# Patient Record
Sex: Female | Born: 2000 | Race: Black or African American | Hispanic: No | Marital: Single | Smoking: Never smoker
Health system: Southern US, Community
[De-identification: ages and names within clinical notes are randomized; demographics above are authoritative.]

## PROBLEM LIST (undated history)

## (undated) ENCOUNTER — Ambulatory Visit (HOSPITAL_COMMUNITY): Admission: EM | Discharge status: Absent without leave | Payer: Medicaid Other

## (undated) DIAGNOSIS — F329 Major depressive disorder, single episode, unspecified: Secondary | ICD-10-CM

## (undated) DIAGNOSIS — L309 Dermatitis, unspecified: Secondary | ICD-10-CM

## (undated) DIAGNOSIS — F32A Depression, unspecified: Secondary | ICD-10-CM

## (undated) HISTORY — DX: Depression, unspecified: F32.A

## (undated) HISTORY — DX: Dermatitis, unspecified: L30.9

---
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
## 2018-06-26 ENCOUNTER — Ambulatory Visit (INDEPENDENT_AMBULATORY_CARE_PROVIDER_SITE_OTHER): Payer: Medicaid Other | Admitting: Certified Nurse Midwife

## 2018-06-26 ENCOUNTER — Other Ambulatory Visit: Payer: Self-pay

## 2018-06-26 ENCOUNTER — Encounter: Payer: Self-pay | Admitting: Certified Nurse Midwife

## 2018-06-26 DIAGNOSIS — N611 Abscess of the breast and nipple: Secondary | ICD-10-CM

## 2018-06-26 DIAGNOSIS — N939 Abnormal uterine and vaginal bleeding, unspecified: Secondary | ICD-10-CM | POA: Diagnosis not present

## 2018-06-26 NOTE — Progress Notes (Signed)
   TELEHEALTH VIRTUAL GYNECOLOGY VISIT ENCOUNTER NOTE  I connected with Patty Ramos on 06/26/18 at 10:00 AM EDT by telephone at home and verified that I am speaking with the correct person using two identifiers.   I discussed the limitations, risks, security and privacy concerns of performing an evaluation and management service by telephone and the availability of in person appointments. I also discussed with the patient that there may be a patient responsible charge related to this service. The patient expressed understanding and agreed to proceed.   History:  Patty Ramos is a 18 y.o. G0P0000 female being evaluated today for abnormal bleeding and breast pain. She reports AUB has been occurring since being off Depo in May 2019 but bleeding increased in January 2020. She reports hx of mastitis in October 2019 which she completed antibiotics for, over the past 2 weeks breast pain has returned. She denies any abnormal vaginal discharge, pelvic pain or other concerns.       History reviewed. No pertinent past medical history. History reviewed. No pertinent surgical history. The following portions of the patient's history were reviewed and updated as appropriate: allergies, current medications, past family history, past medical history, past social history, past surgical history and problem list.   Health Maintenance:  Has never received a pap smear or mammogram, <19yo.   Review of Systems:  Pertinent items noted in HPI and remainder of comprehensive ROS otherwise negative.  Physical Exam:  Physical exam deferred due to nature of the encounter  Labs and Imaging No results found for this or any previous visit (from the past 336 hour(s)). No results found.    Assessment and Plan:     1. Abnormal uterine bleeding (AUB) - AUB since May 2019 after stopping depo, increased in January 2020  - Reports heavy irregular periods then spotting in between periods  - Was started on birth control  pills by Dr Loletta Parish ([facility]) on 06/23/18, on day 4 today of package - patient is unsure of the name  - Instructed that OCP can take 2-3 months to regular cycle and to continue taking medication - Call office and follow up if continued abnormal bleeding in 2-3 months after starting OCP, patient verbalizes understanding.   2. Abscess of female breast - Hx of mastitis diagnosed in October 2019 per patient, was given antibiotics for 10 days which she completed  - reports pain and tenderness with palpation of right breast for past 2 weeks - Order for ultrasound assessment at breast center     I discussed the assessment and treatment plan with the patient. The patient was provided an opportunity to ask questions and all were answered. The patient agreed with the plan and demonstrated an understanding of the instructions.   The patient was advised to call back or seek an in-person evaluation/go to the ED if the symptoms worsen or if the condition fails to improve as anticipated.  I provided 18 minutes of non-face-to-face time during this encounter.   Sharyon Cable, CNM [facility] [employer], [facility]

## 2018-06-26 NOTE — Progress Notes (Signed)
NGYN Telehealth visit to discuss irregular cycles.  LMP 03/28/2018 has no took at home UPT  Pt states cycles are heavy and she is on her cycle throughout most of the month  Pt states she soaks 3 pads in 1 hr    Pt is sexually active admits to unprotected  intercourse in the past 14 days Contraception: NONE   Pt also c/o breast issue/ pain in one area

## 2018-06-27 ENCOUNTER — Other Ambulatory Visit: Payer: Self-pay | Admitting: Certified Nurse Midwife

## 2018-06-27 DIAGNOSIS — N611 Abscess of the breast and nipple: Secondary | ICD-10-CM

## 2018-06-30 ENCOUNTER — Telehealth: Payer: Self-pay | Admitting: Licensed Clinical Social Worker

## 2018-06-30 NOTE — Telephone Encounter (Signed)
Opened by mistake.

## 2018-07-21 ENCOUNTER — Other Ambulatory Visit: Payer: Self-pay | Admitting: Certified Nurse Midwife

## 2018-07-21 DIAGNOSIS — N611 Abscess of the breast and nipple: Secondary | ICD-10-CM

## 2018-07-24 ENCOUNTER — Encounter (HOSPITAL_COMMUNITY): Payer: Self-pay

## 2018-07-24 ENCOUNTER — Ambulatory Visit (HOSPITAL_COMMUNITY)
Admission: EM | Admit: 2018-07-24 | Discharge: 2018-07-24 | Disposition: A | Discharge status: Home / Self Care | Payer: Medicaid Other | Attending: Emergency Medicine | Admitting: Emergency Medicine

## 2018-07-24 ENCOUNTER — Other Ambulatory Visit: Payer: Self-pay

## 2018-07-24 DIAGNOSIS — N61 Mastitis without abscess: Secondary | ICD-10-CM | POA: Insufficient documentation

## 2018-07-24 DIAGNOSIS — A599 Trichomoniasis, unspecified: Secondary | ICD-10-CM

## 2018-07-24 DIAGNOSIS — Z711 Person with feared health complaint in whom no diagnosis is made: Secondary | ICD-10-CM | POA: Insufficient documentation

## 2018-07-24 DIAGNOSIS — Z3202 Encounter for pregnancy test, result negative: Secondary | ICD-10-CM

## 2018-07-24 DIAGNOSIS — N644 Mastodynia: Secondary | ICD-10-CM | POA: Insufficient documentation

## 2018-07-24 DIAGNOSIS — Z113 Encounter for screening for infections with a predominantly sexual mode of transmission: Secondary | ICD-10-CM | POA: Diagnosis not present

## 2018-07-24 HISTORY — DX: Trichomoniasis, unspecified: A59.9

## 2018-07-24 LAB — POCT PREGNANCY, URINE: Preg Test, Ur: NEGATIVE

## 2018-07-24 MED ORDER — DOXYCYCLINE HYCLATE 100 MG PO CAPS: 100.0000 mg | ORAL_CAPSULE | Freq: Two times a day (BID) | ORAL | 0 refills | Status: DC

## 2018-07-24 NOTE — ED Provider Notes (Addendum)
[facility]   [id number] 07/24/18 Arrival Time: 1006   CC: CHEST discomfort   SUBJECTIVE:  Patty Ramos is a [age] y.o. female who presents with complaint of abrupt chest discomfort that began 2 days ago.  Reports reoccuring mastitis that began in November of 2019.  Localizes chest discomfort to the substernal region and left side of breast.  Describes as stable, that is intermittent (with episodes lasting 3 minutes), sharp and cramping in character.  Rates pain as 0/10.   Has tried medications without relief.  Symptoms made worse with deep breath.  Denies radiating symptoms.  Reports previous symptoms in the past. Complains of associated abdominal discomfort, numbness/tingling in lower extremities, and increased urination.  Denies fever, chills, lightheadedness, dizziness, palpitations, tachycardia, nausea, vomiting, diaphoresis,  peripheral edema, or anxiety.    Denies calf pain or swelling, recent long travel, recent surgery, pregnancy, malignancy, hormone use, or previous blood clot  Admits to smoking black and mild  Also requests STD testing.    ROS: As per HPI. History reviewed. No pertinent past medical history. History reviewed. No pertinent surgical history. No Known Allergies No current facility-administered medications on file prior to encounter.    No current outpatient medications on file prior to encounter.   Social History   Socioeconomic History  . Marital status: Single    Spouse name: Not on file  . Number of children: Not on file  . Years of education: Not on file  . Highest education level: Not on file  Occupational History  . Not on file  Social Needs  . Financial resource strain: Not on file  . Food insecurity:    Worry: Not on file    Inability: Not on file  . Transportation needs:    Medical: Not on file    Non-medical: Not on file  Tobacco Use  . Smoking status: Never Smoker  . Smokeless tobacco: Never Used  Substance and Sexual  Activity  . Alcohol use: No  . Drug use: No  . Sexual activity: Yes    Partners: Male    Birth control/protection: None  Lifestyle  . Physical activity:    Days per week: Not on file    Minutes per session: Not on file  . Stress: Not on file  Relationships  . Social connections:    Talks on phone: Not on file    Gets together: Not on file    Attends religious service: Not on file    Active member of club or organization: Not on file    Attends meetings of clubs or organizations: Not on file    Relationship status: Not on file  . Intimate partner violence:    Fear of current or ex partner: Not on file    Emotionally abused: Not on file    Physically abused: Not on file    Forced sexual activity: Not on file  Other Topics Concern  . Not on file  Social History Narrative  . Not on file   History reviewed. No pertinent family history.   OBJECTIVE:  Vitals:   07/24/18 1024 07/24/18 1026  BP:  (!) 143/92  Pulse:  91  Resp:  17  Temp:  98.8 F (37.1 C)  TempSrc:  Oral  SpO2:  100%  Weight: 191 lb (86.6 kg)   Height: 5\' 4"  (1.626 m)     General appearance: alert; no distress Eyes: PERRLA; EOMI; conjunctiva normal HENT: normocephalic; atraumatic Neck: supple Lungs: clear to auscultation bilaterally without  adventitious breath sounds Heart: regular rate and rhythm.  Clear S1 and S2 without rubs, gallops, or murmur. Chest Wall: Exam limited due to patient sitting in wheelchair; breast appear symmetrical; no obvious lumps or masses; nipples with significant dry skin R>L; no obvious drainage; no obvious erythema; reports mild TTP over nipples Abdomen: soft, non-tender; bowel sounds normal; no masses or organomegaly; no guarding or rebound tenderness Extremities: no cyanosis or edema; symmetrical with no gross deformities; no calf tenderness Skin: warm and dry Psychological: alert and cooperative; normal mood and affect  Results for orders placed or performed during the  hospital encounter of 07/24/18 (from the past 24 hour(s))  Pregnancy, urine POC     Status: None   Collection Time: 07/24/18 11:20 AM  Result Value Ref Range   Preg Test, Ur NEGATIVE NEGATIVE    ASSESSMENT & PLAN:  1. Mastitis   2. Breast pain   3. Concern about STD in female without diagnosis     Meds ordered this encounter  Medications  . doxycycline (VIBRAMYCIN) 100 MG capsule    Sig: Take 1 capsule (100 mg total) by mouth 2 (two) times daily.    Dispense:  20 capsule    Refill:  0    Order Specific Question:   Supervising Provider    Answer:   Eustace Moore [[id number]]   Chest pain likely secondary to reoccurring breast infection Will treat with doxycycline Wash daily with warm water and mild soap Follow up with PCP next week for recheck and to ensure symptoms are improving.   Return or go to the ED if you have any new or worsening symptoms such as fever, chills, nausea, vomiting, redness, swelling, worsening pain, symptoms that do not improve following treatment, etc...  Vaginal self-swab obtained.  We will follow up with you regarding abnormal results.    Reviewed expectations re: course of current medical issues. Questions answered. Outlined signs and symptoms indicating need for more acute intervention. Patient verbalized understanding. After Visit Summary given.     Rennis Harding, PA-C 07/24/18 1158

## 2018-07-24 NOTE — ED Triage Notes (Signed)
Patient presents to Urgent Care with complaints of bilateral leg pain and requests for STD check.. Patient states she has burning with urination and the leg pain has been ongoing and recurrent for months.

## 2018-07-24 NOTE — Discharge Instructions (Signed)
Chest pain likely secondary to reoccurring breast infection Will treat with doxycycline Wash daily with warm water and mild soap Follow up with PCP next week for recheck and to ensure symptoms are improving.   Return or go to the ED if you have any new or worsening symptoms such as fever, chills, nausea, vomiting, redness, swelling, worsening pain, symptoms that do not improve following treatment, etc...  Vaginal self-swab obtained.  We will follow up with you regarding abnormal results.

## 2018-07-25 ENCOUNTER — Telehealth (HOSPITAL_COMMUNITY): Payer: Self-pay | Admitting: Emergency Medicine

## 2018-07-25 LAB — CERVICOVAGINAL ANCILLARY ONLY
Bacterial vaginitis: POSITIVE — AB
Candida vaginitis: POSITIVE — AB
Chlamydia: NEGATIVE
Neisseria Gonorrhea: NEGATIVE
Trichomonas: POSITIVE — AB

## 2018-07-25 MED ORDER — FLUCONAZOLE 150 MG PO TABS: 150.0000 mg | ORAL_TABLET | Freq: Once | ORAL | 0 refills | Status: AC

## 2018-07-25 MED ORDER — METRONIDAZOLE 500 MG PO TABS: 500.0000 mg | ORAL_TABLET | Freq: Two times a day (BID) | ORAL | 0 refills | Status: AC

## 2018-07-25 NOTE — Telephone Encounter (Signed)
Bacterial vaginosis is positive. This was not treated at the urgent care visit.  Flagyl 500 mg BID x 7 days #14 no refills sent to patients pharmacy of choice.    Test for candida (yeast) was positive.  Prescription for fluconazole 150mg po now, repeat dose in 3d if needed, #2 no refills, sent to the pharmacy of record.  Recheck or followup with PCP for further evaluation if symptoms are not improving.    Trichomonas is positive. Rx  for Flagyl 2 grams, once was sent to the pharmacy of record. Pt needs education to refrain from sexual intercourse for 7 days to give the medicine time to work. Sexual partners need to be notified and tested/treated. Condoms may reduce risk of reinfection. Recheck for further evaluation if symptoms are not improving.   Patient contacted and made aware of all results, all questions answered.   

## 2018-07-29 ENCOUNTER — Other Ambulatory Visit: Payer: Self-pay | Admitting: Certified Nurse Midwife

## 2018-07-29 DIAGNOSIS — N611 Abscess of the breast and nipple: Secondary | ICD-10-CM

## 2018-08-04 ENCOUNTER — Other Ambulatory Visit: Payer: Self-pay | Admitting: Certified Nurse Midwife

## 2018-08-04 DIAGNOSIS — N611 Abscess of the breast and nipple: Secondary | ICD-10-CM

## 2018-08-04 DIAGNOSIS — N644 Mastodynia: Secondary | ICD-10-CM

## 2018-08-08 ENCOUNTER — Ambulatory Visit: Payer: Medicaid Other

## 2018-08-08 ENCOUNTER — Ambulatory Visit: Admission: RE | Admit: 2018-08-08 | Payer: Medicaid Other | Source: Ambulatory Visit

## 2018-08-08 ENCOUNTER — Ambulatory Visit
Admission: RE | Admit: 2018-08-08 | Discharge: 2018-08-08 | Disposition: A | Discharge status: Home / Self Care | Payer: Medicaid Other | Source: Ambulatory Visit | Attending: Certified Nurse Midwife | Admitting: Certified Nurse Midwife

## 2018-08-08 ENCOUNTER — Other Ambulatory Visit: Payer: Self-pay | Admitting: Certified Nurse Midwife

## 2018-08-08 ENCOUNTER — Other Ambulatory Visit: Payer: Self-pay

## 2018-08-08 DIAGNOSIS — N644 Mastodynia: Secondary | ICD-10-CM

## 2018-08-08 DIAGNOSIS — Z202 Contact with and (suspected) exposure to infections with a predominantly sexual mode of transmission: Secondary | ICD-10-CM

## 2018-08-08 DIAGNOSIS — N611 Abscess of the breast and nipple: Secondary | ICD-10-CM

## 2018-08-08 MED ORDER — AZITHROMYCIN 500 MG PO TABS: 1000.0000 mg | ORAL_TABLET | Freq: Once | ORAL | 0 refills | Status: DC

## 2018-08-08 NOTE — Progress Notes (Signed)
Patient sent MyChart message notifying provider of partner testing positive for Chlamydia, Rx for treatment sent to pharmacy on file due to recent exposure.   Sharyon Cable, CNM 08/08/18, 1:20 PM

## 2018-09-01 ENCOUNTER — Other Ambulatory Visit: Payer: Self-pay | Admitting: Family Medicine

## 2018-09-19 ENCOUNTER — Telehealth: Payer: Self-pay | Admitting: *Deleted

## 2018-09-19 ENCOUNTER — Other Ambulatory Visit: Payer: Self-pay

## 2018-09-19 ENCOUNTER — Encounter (HOSPITAL_COMMUNITY): Payer: Self-pay

## 2018-09-19 ENCOUNTER — Other Ambulatory Visit: Payer: Medicaid Other

## 2018-09-19 ENCOUNTER — Ambulatory Visit (HOSPITAL_COMMUNITY)
Admission: EM | Admit: 2018-09-19 | Discharge: 2018-09-19 | Disposition: A | Discharge status: Home / Self Care | Payer: Medicaid Other | Attending: Emergency Medicine | Admitting: Emergency Medicine

## 2018-09-19 DIAGNOSIS — Z20822 Contact with and (suspected) exposure to covid-19: Secondary | ICD-10-CM

## 2018-09-19 DIAGNOSIS — R509 Fever, unspecified: Secondary | ICD-10-CM

## 2018-09-19 DIAGNOSIS — N899 Noninflammatory disorder of vagina, unspecified: Secondary | ICD-10-CM

## 2018-09-19 DIAGNOSIS — Z113 Encounter for screening for infections with a predominantly sexual mode of transmission: Secondary | ICD-10-CM

## 2018-09-19 DIAGNOSIS — J039 Acute tonsillitis, unspecified: Secondary | ICD-10-CM

## 2018-09-19 LAB — POCT INFECTIOUS MONO SCREEN: Mono Screen: NEGATIVE

## 2018-09-19 LAB — POCT RAPID STREP A: Streptococcus, Group A Screen (Direct): NEGATIVE

## 2018-09-19 MED ORDER — ACETAMINOPHEN 325 MG PO TABS
ORAL_TABLET | ORAL | Status: AC
  Filled 2018-09-19: qty 2

## 2018-09-19 MED ORDER — ACETAMINOPHEN 325 MG PO TABS
650.0000 mg | ORAL_TABLET | Freq: Once | ORAL | Status: AC
  Administered 2018-09-19: 650 mg via ORAL

## 2018-09-19 MED ORDER — PENICILLIN G BENZATHINE 1200000 UNIT/2ML IM SUSP
1.2000 10*6.[IU] | Freq: Once | INTRAMUSCULAR | Status: AC
  Administered 2018-09-19: 1.2 10*6.[IU] via INTRAMUSCULAR

## 2018-09-19 MED ORDER — PENICILLIN G BENZATHINE 1200000 UNIT/2ML IM SUSP
INTRAMUSCULAR | Status: AC
  Filled 2018-09-19: qty 2

## 2018-09-19 NOTE — Telephone Encounter (Signed)
-----   Message from Zigmund Gottron, NP sent at 09/19/2018  9:29 AM EDT ----- Regarding: covid testing needed

## 2018-09-19 NOTE — ED Triage Notes (Addendum)
Patient presents to Urgent Care with complaints of sore throat that radiates up the left side of her face since yesterday. Patient reports she is having difficulty swallowing, but was drinking a bottle of orange juice in the lobby.  Pt also requesting STD check today.

## 2018-09-19 NOTE — Discharge Instructions (Signed)
We have treated you today for tonsillitis.  Push fluids to ensure adequate hydration and keep secretions thin.  STD screen is pending. Will notify of any positive findings and if any changes to treatment are needed.   Tylenol and/or ibuprofen as needed for pain or fevers.   Due to your symptoms in presence of pandemic I do recommend Covid-19 testing. You will be called to set up an appointment time to be tested at our [facility]. Results take about 2-3 days.  Please self isolate until you receive this results.   If develop worsening of symptoms, unable to swallow your secretions, increased pain or fever, please go to the ER.  If develop

## 2018-09-19 NOTE — ED Provider Notes (Signed)
n [place]    CSN: [id number] Arrival date & time: 09/19/18  7824     History   Chief Complaint Chief Complaint  Patient presents with  . Sore Throat    HPI Patty Ramos is a 18 y.o. female.   Patty Ramos presents with complaints of sore throat. Difficulty with swallowing. Yesterday hot and cold. Fever last night. Left ear pain. Today worse. Worse on the left side of neck and throat. No cough, no congestion. No nausea, vomiting or diarrhea. Headache. No body aches. Hasn't taken any medications for symptoms. No known ill contacts. Works at [employer], not necessarily around many people. Also requests STD screen. Intermittent discharge and irritation. 1 partner, but ex bf cheated her. No urinary symptoms. Without contributing medical history.      ROS per HPI, negative if not otherwise mentioned.      History reviewed. No pertinent past medical history.  There are no active problems to display for this patient.   History reviewed. No pertinent surgical history.  OB History    Gravida  0   Para  0   Term  0   Preterm  0   AB  0   Living  0     SAB  0   TAB  0   Ectopic  0   Multiple  0   Live Births  0            Home Medications    Prior to Admission medications   Medication Sig Start Date End Date Taking? Authorizing Provider  doxycycline (VIBRAMYCIN) 100 MG capsule Take 1 capsule (100 mg total) by mouth 2 (two) times daily. 07/24/18   Lestine Box, PA-C    Family History Family History  Problem Relation Age of Onset  . Healthy Mother     Social History Social History   Tobacco Use  . Smoking status: Never Smoker  . Smokeless tobacco: Never Used  Substance Use Topics  . Alcohol use: No  . Drug use: No     Allergies   Patient has no known allergies.   Review of Systems Review of Systems   Physical Exam Triage Vital Signs ED Triage Vitals  Enc Vitals Group     BP 09/19/18 0842 (!) 150/88   Pulse Rate 09/19/18 0842 (!) 123     Resp 09/19/18 0842 19     Temp 09/19/18 0842 (!) 102.1 F (38.9 C)     Temp Source 09/19/18 0842 Oral     SpO2 09/19/18 0842 99 %     Weight --      Height --      Head Circumference --      Peak Flow --      Pain Score 09/19/18 0840 10     Pain Loc --      Pain Edu? --      Excl. in [place]? --    No data found.  Updated Vital Signs BP (!) 150/88 (BP Location: Right Arm)   Pulse (!) 123   Temp (!) 102.1 F (38.9 C) (Oral)   Resp 19   LMP 08/27/2018   SpO2 99%    Physical Exam Constitutional:      General: She is not in acute distress.    Appearance: She is well-developed.  HENT:     Head: Normocephalic and atraumatic.     Nose:     Comments: Hot potato voice noted; drinking fluids without difficulty  Mouth/Throat:     Tonsils: Tonsillar exudate present. 2+ on the left.  Cardiovascular:     Rate and Rhythm: Regular rhythm. Tachycardia present.     Heart sounds: Normal heart sounds.  Pulmonary:     Effort: Pulmonary effort is normal.     Breath sounds: Normal breath sounds.  Lymphadenopathy:     Cervical: Cervical adenopathy present.  Skin:    General: Skin is warm and dry.  Neurological:     Mental Status: She is alert and oriented to person, place, and time.      UC Treatments / Results  Labs (all labs ordered are listed, but only abnormal results are displayed) Labs Reviewed  CULTURE, GROUP A STREP [facility])  POCT RAPID STREP A  POCT INFECTIOUS MONO SCREEN  POCT INFECTIOUS MONO SCREEN  CERVICOVAGINAL ANCILLARY ONLY    EKG None  Radiology No results found.  Procedures Procedures (including critical care time)  Medications Ordered in UC Medications  acetaminophen (TYLENOL) tablet 650 mg (650 mg Oral Given 09/19/18 0853)  penicillin g benzathine (BICILLIN LA) 1200000 UNIT/2ML injection 1.2 Million Units (1.2 Million Units Intramuscular Given 09/19/18 0946)  acetaminophen (TYLENOL) 325 MG tablet (has no  administration in time range)  penicillin g benzathine (BICILLIN LA) 1200000 UNIT/2ML injection (has no administration in time range)    Initial Impression / Assessment and Plan / UC Course  I have reviewed the triage vital signs and the nursing notes.  Pertinent labs & imaging results that were available during my care of the patient were reviewed by me and considered in my medical decision making (see chart for details).     Negative strep and mono here today. Febrile, tachycardic, with quite impressive left tonsil with exudate and cervical lymphadenopathy. IM bicillin provided with strep culture pending. Recommended covid testing as well with message for order sent. Return precautions provided. Patient verbalized understanding and agreeable to plan.   Final Clinical Impressions(s) / UC Diagnoses   Final diagnoses:  Acute tonsillitis, unspecified etiology     Discharge Instructions     We have treated you today for tonsillitis.  Push fluids to ensure adequate hydration and keep secretions thin.  STD screen is pending. Will notify of any positive findings and if any changes to treatment are needed.   Tylenol and/or ibuprofen as needed for pain or fevers.   Due to your symptoms in presence of pandemic I do recommend Covid-19 testing. You will be called to set up an appointment time to be [facility]. Results take about 2-3 days.  Please self isolate until you receive this results.   If develop worsening of symptoms, unable to swallow your secretions, increased pain or fever, please go to the ER.  If develop     ED Prescriptions    None     Controlled Substance Prescriptions [place] Controlled Substance Registry consulted? Not Applicable   Georgetta HaberBurky, Aniceto Kyser B, NP 09/19/18 1151

## 2018-09-19 NOTE — Telephone Encounter (Signed)
Patient called and scheduled for COVID-19 testing on 09/19/18 at [facility] site. Pt given address. Advised to wear a mask and remain in car at appt time. Understanding verbalized.

## 2018-09-21 LAB — CULTURE, GROUP A STREP (THRC)

## 2018-09-23 LAB — CERVICOVAGINAL ANCILLARY ONLY
Bacterial vaginitis: NEGATIVE
Candida vaginitis: POSITIVE — AB
Chlamydia: NEGATIVE
Neisseria Gonorrhea: NEGATIVE
Trichomonas: NEGATIVE

## 2018-09-24 ENCOUNTER — Telehealth (HOSPITAL_COMMUNITY): Payer: Self-pay | Admitting: Emergency Medicine

## 2018-09-24 LAB — NOVEL CORONAVIRUS, NAA: SARS-CoV-2, NAA: NOT DETECTED

## 2018-09-24 MED ORDER — FLUCONAZOLE 150 MG PO TABS: 150.0000 mg | ORAL_TABLET | Freq: Once | ORAL | 0 refills | Status: AC

## 2018-09-24 NOTE — Telephone Encounter (Signed)
Test for candida (yeast) was positive.  Prescription for fluconazole 150mg po now, repeat dose in 3d if needed, #2 no refills, sent to the pharmacy of record.  Recheck or followup with PCP for further evaluation if symptoms are not improving.    Patient contacted and made aware of all results, all questions answered.   

## 2018-09-25 ENCOUNTER — Telehealth (HOSPITAL_COMMUNITY): Payer: Self-pay | Admitting: Emergency Medicine

## 2018-09-25 NOTE — Telephone Encounter (Signed)
Your test for COVID-19 was negative.  Please continue good preventive care measures, including:  frequent hand-washing, avoid touching your face, cover coughs/sneezes, stay out of crowds and keep a 6 foot distance from others.  If you develop fever/cough/breathlessness, please stay home for 10 days and until you have had 3 consecutive days with cough/breathlessness improving and without fever (without taking a fever reducer). Go to the nearest hospital ED tent for assessment if fever/cough/breathlessness are severe or illness seems like a threat to life..  Patient contacted and made aware of all results, all questions answered.  

## 2018-10-02 ENCOUNTER — Encounter: Payer: Self-pay | Admitting: Obstetrics

## 2018-10-02 ENCOUNTER — Ambulatory Visit (INDEPENDENT_AMBULATORY_CARE_PROVIDER_SITE_OTHER): Payer: Medicaid Other | Admitting: Obstetrics

## 2018-10-02 ENCOUNTER — Other Ambulatory Visit: Payer: Self-pay

## 2018-10-02 VITALS — BP 142/84 | HR 69 | Wt 191.0 lb

## 2018-10-02 DIAGNOSIS — B369 Superficial mycosis, unspecified: Secondary | ICD-10-CM | POA: Diagnosis not present

## 2018-10-02 DIAGNOSIS — R030 Elevated blood-pressure reading, without diagnosis of hypertension: Secondary | ICD-10-CM | POA: Diagnosis not present

## 2018-10-02 DIAGNOSIS — N939 Abnormal uterine and vaginal bleeding, unspecified: Secondary | ICD-10-CM

## 2018-10-02 DIAGNOSIS — G43019 Migraine without aura, intractable, without status migrainosus: Secondary | ICD-10-CM | POA: Diagnosis not present

## 2018-10-02 MED ORDER — NORETHINDRONE ACETATE 5 MG PO TABS: 5.0000 mg | ORAL_TABLET | Freq: Every day | ORAL | 0 refills | Status: DC

## 2018-10-02 MED ORDER — CLOTRIMAZOLE 1 % EX CREA: 1.0000 "application " | TOPICAL_CREAM | Freq: Two times a day (BID) | CUTANEOUS | 1 refills | Status: DC

## 2018-10-02 NOTE — Progress Notes (Signed)
Patient ID: Patty Ramos, female   DOB: 02-01-2001, 18 y.o.   MRN: [id number]  Chief Complaint  Patient presents with  . Gynecologic Exam    HPI Patty Ramos is a 18 y.o. female.  Irregular vaginal bleeding since January.  Contraception:  None.  She also has a history of elevated BP and migraine type headaches.  Has a rash on breasts that she is treating with a steroid cream, with improvement. HPI  History reviewed. No pertinent past medical history.  History reviewed. No pertinent surgical history.  Family History  Problem Relation Age of Onset  . Healthy Mother     Social History Social History   Tobacco Use  . Smoking status: Never Smoker  . Smokeless tobacco: Never Used  Substance Use Topics  . Alcohol use: No  . Drug use: No    No Known Allergies  Current Outpatient Medications  Medication Sig Dispense Refill  . norethindrone (AYGESTIN) 5 MG tablet Take 1 tablet (5 mg total) by mouth daily. 10 tablet 0   No current facility-administered medications for this visit.     Review of Systems Review of Systems Constitutional: negative for fatigue and weight loss Respiratory: negative for cough and wheezing Cardiovascular: negative for chest pain, fatigue and palpitations Gastrointestinal: negative for abdominal pain and change in bowel habits Genitourinary:negative Integument/breast: negative for nipple discharge Musculoskeletal:negative for myalgias Neurological: negative for gait problems and tremors Behavioral/Psych: negative for abusive relationship, depression Endocrine: negative for temperature intolerance      Blood pressure (!) 142/84, pulse 69, weight 191 lb (86.6 kg), last menstrual period 03/30/2018.  Physical Exam Physical Exam General:   alert  Skin:   no rash or abnormalities  Lungs:   clear to auscultation bilaterally  Heart:   regular rate and rhythm, S1, S2 normal, no murmur, click, rub or gallop  Breasts:   scaly and dry periareolar skin  changes    50% of 15 min visit spent on counseling and coordination of care.   Data Reviewed Wet prep and cultures Results for Patty Ramos, Patty Ramos (MRN [id number]) as of 10/02/2018 11:54  Ref. Range 09/19/2018 00:00  Chlamydia Unknown Negative  Neisseria gonorrhea Unknown Negative  Bacterial vaginitis Unknown Negative for Bacterial Vaginitis Microorganisms  Trichomonas Unknown Negative  Candida vaginitis Unknown **POSITIVE for Candida species** (A)   Assessment and Plan:    1. Abnormal uterine bleeding (AUB) - O Rx: - norethindrone (AYGESTIN) 5 MG tablet; Take 1 tablet (5 mg total) by mouth daily.  Dispense: 10 tablet; Refill: 0  2. Intractable migraine without aura and without status migrainosus Rx: - Ambulatory referral to Neurology  3. Elevated blood pressure reading without diagnosis of hypertension Rx: - Ambulatory referral to Internal Medicine  4. Superficial fungal infection of breasts Rx: - Clotrimazole 1% cream prescribed.  Apply bid to affected areas.     Plan    Follow up in 3 months  Orders Placed This Encounter  Procedures  . Ambulatory referral to Neurology    Referral Priority:   Routine    Referral Type:   Consultation    Referral Reason:   Specialty Services Required    Requested Specialty:   Neurology    Number of Visits Requested:   1  . Ambulatory referral to Internal Medicine    Referral Priority:   Routine    Referral Type:   Consultation    Referral Reason:   Specialty Services Required    Requested Specialty:   Internal Medicine  Number of Visits Requested:   1   Meds ordered this encounter  Medications  . norethindrone (AYGESTIN) 5 MG tablet    Sig: Take 1 tablet (5 mg total) by mouth daily.    Dispense:  10 tablet    Refill:  0    Shelly Bombard MD 10-02-2018

## 2018-10-02 NOTE — Progress Notes (Signed)
Pt states she has been bleeding since January. Pt states cycle is heavy.

## 2018-10-20 ENCOUNTER — Telehealth: Payer: Self-pay

## 2018-10-20 ENCOUNTER — Ambulatory Visit: Payer: Medicaid Other | Admitting: Neurology

## 2018-10-20 NOTE — Telephone Encounter (Signed)
Pt arrived 13 mins late for her pt, was asked to r/s.

## 2018-10-21 ENCOUNTER — Encounter: Payer: Self-pay | Admitting: Neurology

## 2018-11-11 ENCOUNTER — Other Ambulatory Visit: Payer: Self-pay

## 2018-11-11 ENCOUNTER — Other Ambulatory Visit: Payer: Self-pay | Admitting: Obstetrics

## 2018-11-11 ENCOUNTER — Ambulatory Visit (INDEPENDENT_AMBULATORY_CARE_PROVIDER_SITE_OTHER): Payer: Medicaid Other | Admitting: Neurology

## 2018-11-11 ENCOUNTER — Encounter: Payer: Self-pay | Admitting: Neurology

## 2018-11-11 VITALS — BP 156/98 | HR 88 | Ht 64.0 in | Wt 193.0 lb

## 2018-11-11 DIAGNOSIS — R0683 Snoring: Secondary | ICD-10-CM | POA: Diagnosis not present

## 2018-11-11 DIAGNOSIS — G43019 Migraine without aura, intractable, without status migrainosus: Secondary | ICD-10-CM

## 2018-11-11 DIAGNOSIS — E669 Obesity, unspecified: Secondary | ICD-10-CM

## 2018-11-11 DIAGNOSIS — N939 Abnormal uterine and vaginal bleeding, unspecified: Secondary | ICD-10-CM

## 2018-11-11 DIAGNOSIS — J351 Hypertrophy of tonsils: Secondary | ICD-10-CM

## 2018-11-11 MED ORDER — RIZATRIPTAN BENZOATE 5 MG PO TABS: 5.0000 mg | ORAL_TABLET | ORAL | 3 refills | Status: DC | PRN

## 2018-11-11 MED ORDER — NORTRIPTYLINE HCL 10 MG PO CAPS: ORAL_CAPSULE | ORAL | 5 refills | Status: DC

## 2018-11-11 NOTE — Patient Instructions (Signed)
You likely have migraine headaches.  Please remember, common headache triggers are: sleep deprivation, dehydration, overheating, stress, hypoglycemia or skipping meals and blood sugar fluctuations, excessive pain medications or excessive alcohol use or caffeine withdrawal. Some people have food triggers such as aged cheese, orange juice or chocolate, especially dark chocolate, or MSG (monosodium glutamate). Try to avoid these headache triggers as much possible. It may be helpful to keep a headache diary to figure out what makes your headaches worse or brings them on and what alleviates them. Some people report headache onset after exercise but studies have shown that regular exercise may actually prevent headaches from coming. If you have exercise-induced headaches, please make sure that you drink plenty of fluid before and after exercising and that you do not over do it and do not overheat.  For migraine prevention, I would like to suggest: Pamelor (generic name: nortriptyline), 10 mg: Take 1 pill daily at bedtime for one week, then 2 pills daily at bedtime thereafter. Common side effects reported are: mouth dryness, drowsiness, confusion, dizziness.   We will do a sleep study to rule out Obstructive sleep apnea which can contribute to recurrent headaches.  For migraine abortive treatment, use Maxalt, 5 mg: take 1 pill early on when you suspect a migraine attack come on. You may take another pill within 2 hours, no more than 2 pills in 24 hours. Most people who take triptans do not have any serious side-effects. However, they can cause drowsiness (remember to not drive or use heavy machinery when drowsy), nausea, dizziness, dry mouth. Less common side effects include strange sensations, such as tightness in your chest or throat, tingling, flushing, and feelings of heaviness or pressure in areas such as the face, limbs, and chest. These in the chest can mimic heart related pain (angina) and may cause alarm,  but usually these sensations are not harmful or a sign of a heart attack. However, if you develop intense chest pain or sensations of discomfort, you should stop taking your medication and consult with me or your PCP or go to the nearest urgent care facility or ER or call 911.

## 2018-11-11 NOTE — Progress Notes (Signed)
Subjective:    Patient ID: Patty Ramos is a 18 y.o. female.  HPI     Huston FoleySaima Glennis Borger, MD, PhD [facility] [address], [address] [address] [place], [place] [id number]  Dear Dr. Clearance CootsHarper, I saw your patient, Patty Ramos, upon your kind request in my neurologic clinic today for initial consultation of her recurrent headaches.  The patient is unaccompanied today.  As you know, Ms. Patty NimrodHarvey is a 18 year old right-handed woman with an underlying medical history of abnormal uterine bleeding, and obesity, who reports recurrent migrainous headaches for the past months.  I reviewed your office note from 10/02/2018.  She was started on a progestin at the time.  She believes her headache frequency has decreased since then to about 1 or 2/week as opposed to nearly daily.  She has associated photophobia and nausea, no significant vomiting.  It helps to go to bed to sleep, sometimes she has a headache at night, she has had elevated blood pressure values without a formal diagnosis of hypertension.  She does snore.  She is not aware of any family history of migraines or sleep apnea.  She has had a recent pharyngitis, treated with antibiotic medication.  She lives with a close friend.  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  She has a left-sided headache often, either in the front or in the temporal area on the left with a throbbing sensation.  She has tried over-the-counter Aleve or ibuprofen which does not typically help very much.  She feels that the headaches can last all day.  She has not noticed any other neurological accompaniments and denies any one-sided weakness, numbness, tingling, slurring of speech or droopy face, she has no visual symptoms, in particular, no blurry vision, no double vision or loss of vision.  She does not drink caffeine daily, she tries to hydrate well with water.  She denies alcohol use.  She smokes about 2 cigarettes/day and smokes marijuana occasionally.  Her Past  Medical History Is Significant For: History reviewed. No pertinent past medical history.  Her Past Surgical History Is Significant For: History reviewed. No pertinent surgical history.  Her Family History Is Significant For: Family History  Problem Relation Age of Onset  . Healthy Mother     Her Social History Is Significant For: Social History   Socioeconomic History  . Marital status: Single    Spouse name: Not on file  . Number of children: Not on file  . Years of education: Not on file  . Highest education level: Not on file  Occupational History  . Not on file  Social Needs  . Financial resource strain: Not on file  . Food insecurity    Worry: Not on file    Inability: Not on file  . Transportation needs    Medical: Not on file    Non-medical: Not on file  Tobacco Use  . Smoking status: Never Smoker  . Smokeless tobacco: Never Used  Substance and Sexual Activity  . Alcohol use: No  . Drug use: No  . Sexual activity: Yes    Partners: Male    Birth control/protection: None  Lifestyle  . Physical activity    Days per week: Not on file    Minutes per session: Not on file  . Stress: Not on file  Relationships  . Social Musicianconnections    Talks on phone: Not on file    Gets together: Not on file    Attends religious service: Not on  file    Active member of club or organization: Not on file    Attends meetings of clubs or organizations: Not on file    Relationship status: Not on file  Other Topics Concern  . Not on file  Social History Narrative  . Not on file    Her Allergies Are:  No Known Allergies:   Her Current Medications Are:  Outpatient Encounter Medications as of 11/11/2018  Medication Sig  . norethindrone (AYGESTIN) 5 MG tablet Take 1 tablet (5 mg total) by mouth daily.  . nortriptyline (PAMELOR) 10 MG capsule take 1 pill daily at bedtime for one week, then 2 pills daily at bedtime thereafter.  . rizatriptan (MAXALT) 5 MG tablet Take 1 tablet (5  mg total) by mouth as needed for migraine. May repeat in 2 h prn, no more than 2 pills/24 h, no more than 3 pills/week.  . [DISCONTINUED] clotrimazole (LOTRIMIN) 1 % cream Apply 1 application topically 2 (two) times daily.   No facility-administered encounter medications on file as of 11/11/2018.   :   Review of Systems:  Out of a complete 14 point review of systems, all are reviewed and negative with the exception of these symptoms as listed below:    Review of Systems  Neurological:       Pt presents today to discuss her migraines. Pt was having migraines daily but since starting PO hormone pills, her migraines have decreased to 1 ever 2-3 days. The migraine will last all day. She has associated nausea and vomiting, light and sound sensitivity. Pt does endorse snoring but has not had a sleep study.  Epworth Sleepiness Scale 0= would never doze 1= slight chance of dozing 2= moderate chance of dozing 3= high chance of dozing  Sitting and reading: 0 Watching TV: 3 Sitting inactive in a public place (ex. Theater or meeting): 0 As a passenger in a car for an hour without a break: 1 Lying down to rest in the afternoon: 0 Sitting and talking to someone: 0 Sitting quietly after lunch (no alcohol): 0 In a car, while stopped in traffic: 0 Total: 4     Objective:  Neurological Exam  Physical Exam Physical Examination:   Vitals:   11/11/18 1012  BP: (!) 156/98  Pulse: 88    General Examination: The patient is a very pleasant 18 y.o. female in no acute distress. She appears well-developed and well-nourished and well groomed.   HEENT: Normocephalic, atraumatic, pupils are equal, round and reactive to light and accommodation. Funduscopic exam is normal with sharp disc margins noted. Extraocular tracking is good without limitation to gaze excursion or nystagmus noted. Normal smooth pursuit is noted. Hearing is grossly intact. Face is symmetric with normal facial animation and normal  facial sensation. Speech is clear with no dysarthria noted. There is no hypophonia. There is no lip, neck/head, jaw or voice tremor. Neck is supple with full range of passive and active motion. There are no carotid bruits on auscultation. Oropharynx exam reveals: mild mouth dryness, adequate dental hygiene and marked airway crowding, due to Small airway entry, slightly elongated uvula and tonsillar size of 2-3+.  Mallampati is class II.  Tongue protrudes centrally in palate elevates symmetrically.  Neck circumference is 15-1/2 inches.   Chest: Clear to auscultation without wheezing, rhonchi or crackles noted.  Heart: S1+S2+0, regular and normal without murmurs, rubs or gallops noted.   Abdomen: Soft, non-tender and non-distended with normal bowel sounds appreciated on auscultation.  Extremities: There  is no pitting edema in the distal lower extremities bilaterally. Pedal pulses are intact.  Skin: Warm and dry without trophic changes noted.  Musculoskeletal: exam reveals no obvious joint deformities, tenderness or joint swelling or erythema.   Neurologically:  Mental status: The patient is awake, alert and oriented in all 4 spheres. Her immediate and remote memory, attention, language skills and fund of knowledge are appropriate. There is no evidence of aphasia, agnosia, apraxia or anomia. Speech is clear with normal prosody and enunciation. Thought process is linear. Mood is normal and affect is normal.  Cranial nerves II - XII are as described above under HEENT exam. In addition: shoulder shrug is normal with equal shoulder height noted. Motor exam: Normal bulk, strength and tone is noted. There is no drift, tremor or rebound. Romberg is negative. Reflexes are 2+ throughout. Babinski: Toes are flexor bilaterally. Fine motor skills and coordination: intact with normal finger taps, normal hand movements, normal rapid alternating patting, normal foot taps and normal foot agility.  Cerebellar testing:  No dysmetria or intention tremor on finger to nose testing. Heel to shin is unremarkable bilaterally. There is no truncal or gait ataxia.  Sensory exam: intact to light touch, pinprick, vibration, temperature sense in the upper and lower extremities.  Gait, station and balance: She stands easily. No veering to one side is noted. No leaning to one side is noted. Posture is age-appropriate and stance is narrow based. Gait shows normal stride length and normal pace. No problems turning are noted. Tandem walk is unremarkable.   Assessment and Plan:  In summary, Patty Ramos is a very pleasant 18 y.o.-year old female with an underlying medical history of abnormal uterine bleeding, and obesity, who Presents for evaluation of her recurrent headaches.  Her history is suggestive of migraine without aura, currently frequent enough to warrant preventative medication.  She has a nonfocal neurological exam.  Because of snoring reported and obesity and recurrent headaches, I would like to proceed with a sleep study to rule out obstructive sleep apnea.  We talked at length about headache triggers, we also talked about management including acute management and preventative medications.  I would like for her to start preventative medication in the form of nortriptyline low dose, 10 mg, with gradual increase.  For abortive use for migraine I suggested Maxalt 5 mg strength as needed, may repeat once after 2 hours.  She was given written instructions and new prescriptions.  She is willing to proceed with a sleep study.  We will call her to schedule this.  She is advised to proceed with treatment of sleep apnea should her sleep study show obstructive sleep apnea.  She would be willing to try AutoPap or CPAP therapy.  I suggested follow-up with 1 of our nurse practitioners in 3 months to see how she has done, she is advised to call with any interim questions or concerns and we will also be in touch by phone regarding her sleep  study scheduling and results thereafter.  I answered all her questions today and she was in agreement.  Thank you very much for allowing me to participate in the care of this nice patient. If I can be of any further assistance to you please do not hesitate to call me at [phone].  Sincerely,   Huston FoleySaima Eevie Lapp, MD, PhD

## 2018-11-19 ENCOUNTER — Other Ambulatory Visit: Payer: Self-pay

## 2018-11-19 DIAGNOSIS — N939 Abnormal uterine and vaginal bleeding, unspecified: Secondary | ICD-10-CM

## 2018-11-21 NOTE — Telephone Encounter (Signed)
Rx request from pharmacy    norethindrone (AYGESTIN) 5 MG tablet        Possible duplicate: Hover to review recent actions on this medication   Sig: Take 1 tablet (5 mg total) by mouth daily.   Disp:  10 tablet  Refills:  0   Start: 11/19/2018   Class: Normal   Non-formulary For: Abnormal uterine bleeding (AUB)   Last ordered: 1 month ago by Shelly Bombard, MD      To be filled at: [id number] - [place], [place]

## 2018-12-01 ENCOUNTER — Ambulatory Visit (HOSPITAL_COMMUNITY)
Admission: EM | Admit: 2018-12-01 | Discharge: 2018-12-01 | Disposition: A | Discharge status: Home / Self Care | Payer: Medicaid Other

## 2018-12-01 ENCOUNTER — Other Ambulatory Visit: Payer: Self-pay

## 2018-12-01 NOTE — ED Notes (Signed)
Staff called patient 3x and checked the waiting area bathroom  before dismissing them off the floor.

## 2018-12-09 ENCOUNTER — Ambulatory Visit (INDEPENDENT_AMBULATORY_CARE_PROVIDER_SITE_OTHER): Payer: Medicaid Other | Admitting: Neurology

## 2018-12-09 DIAGNOSIS — E669 Obesity, unspecified: Secondary | ICD-10-CM

## 2018-12-09 DIAGNOSIS — G43019 Migraine without aura, intractable, without status migrainosus: Secondary | ICD-10-CM

## 2018-12-09 DIAGNOSIS — R0683 Snoring: Secondary | ICD-10-CM

## 2018-12-09 DIAGNOSIS — G4733 Obstructive sleep apnea (adult) (pediatric): Secondary | ICD-10-CM | POA: Diagnosis not present

## 2018-12-09 DIAGNOSIS — G4720 Circadian rhythm sleep disorder, unspecified type: Secondary | ICD-10-CM

## 2018-12-09 DIAGNOSIS — J351 Hypertrophy of tonsils: Secondary | ICD-10-CM

## 2018-12-15 NOTE — Procedures (Signed)
PATIENT'S NAME:  Patty Ramos, Patty Ramos DOB:      [birth year]      MRN:    [id number]     DATE OF RECORDING: 12/09/2018 REFERRING M.D.:  Baltazar Najjar, MD  Study Performed:   Baseline Polysomnogram HISTORY: 18 year old woman with a history of abnormal uterine bleeding, and obesity, who reports recurrent migrainous headaches for the past months. She snores. The patient endorsed the Epworth Sleepiness Scale at 4/24 points and the FSS at   Points. The patient's weight 193 pounds with a height of 64 (inches), resulting in a BMI of 33.1 kg/m2. The patient's neck circumference measured 15.5 inches.  CURRENT MEDICATIONS: Aygestin, Pamelor, Maxalt.   PROCEDURE:  This is a multichannel digital polysomnogram utilizing the Somnostar 11.2 system.  Electrodes and sensors were applied and monitored per AASM Specifications.   EEG, EOG, Chin and Limb EMG, were sampled at 200 Hz.  ECG, Snore and Nasal Pressure, Thermal Airflow, Respiratory Effort, CPAP Flow and Pressure, Oximetry was sampled at 50 Hz. Digital video and audio were recorded.      BASELINE STUDY: Lights Out was at 22:47 and Lights On at 05:00.  Total recording time (TRT) was 373.5 minutes, with a total sleep time (TST) of 364.5 minutes.   The patient's sleep latency was 0.5 minutes.  REM latency was 170 minutes, which is delayed. The sleep efficiency was 97.6 %.     SLEEP ARCHITECTURE: WASO (Wake after sleep onset) was 8 minutes.  There were 1 minutes in Stage N1, 207.5 minutes Stage N2, 123 minutes Stage N3 and 33 minutes in Stage REM.  The percentage of Stage N1 was .3%, Stage N2 was 56.9%, Stage N3 was 33.7%, which is increased, and Stage R (REM sleep) was 9.1%, which is reduced.  RESPIRATORY ANALYSIS:  There were a total of 25 respiratory events:  4 obstructive apneas, 8 central apneas and 0 mixed apneas with a total of 12 apneas and an apnea index (AI) of 2. /hour. There were 13 hypopneas with a hypopnea index of 2.1 /hour. The patient also had 0  respiratory event related arousals (RERAs).      The total APNEA/HYPOPNEA INDEX (AHI) was 4.1 /hour and the total RESPIRATORY DISTURBANCE INDEX was 4.1 /hour.  10 events occurred in REM sleep and 20 events in NREM. The REM AHI was 18.2 /hour, versus a non-REM AHI of 2.7. The patient spent 118.5 minutes of total sleep time in the supine position and 246 minutes in non-supine.. The supine AHI was 8.1/h versus a non-supine AHI of 2.2/h.  OXYGEN SATURATION & C02:  The Wake baseline 02 saturation was 97%, with the lowest being 90%. Time spent below 89% saturation equaled 0 minutes.  AROUSALS:  The arousals were noted as: 30 were spontaneous, 0 were associated with PLMs, 5 were associated with respiratory events.  The patient had a total of 0 Periodic Limb Movements.  The Periodic Limb Movement (PLM) index was 0 and the PLM Arousal index was 0/hour.  Audio and video analysis did not show any abnormal or unusual movements, complex behaviors, phonations or vocalizations. The patient took no bathroom breaks. Moderate to loud snoring was noted. The EKG in normal sinus rhythm (NSR).  IMPRESSION:  1. Primary Snoring 2. Dysfunctions associated with sleep stages or arousals from sleep   RECOMMENDATIONS:  1. This study does not demonstrate any significant obstructive or central sleep disordered breathing, except for snoring and REM related, mild OSA. Treatment with CPAP is not warranted. Avoidance of the supine  sleep position and weight loss are recommended. This study does not support an intrinsic sleep disorder as a cause of the patient's symptoms. Other causes, including circadian rhythm disturbances, an underlying mood disorder, medication effect and/or an underlying medical problem cannot be ruled out. 2. This study shows no significant sleep fragmentation and but some abnormal sleep stage percentages; these are nonspecific findings and per se do not signify an intrinsic sleep disorder or a cause for the  patient's sleep-related symptoms. Causes include (but are not limited to) the first night effect of the sleep study, circadian rhythm disturbances, medication effect or an underlying mood disorder or medical problem.  3. The patient should be cautioned not to drive, work at heights, or operate dangerous or heavy equipment when tired or sleepy. Review and reiteration of good sleep hygiene measures should be pursued with any patient. 4. The [facility]. The referring provider will be notified of the test results.  I certify that I have reviewed the entire raw data recording prior to the issuance of this report in accordance with the Standards of Accreditation of the American Academy of Sleep Medicine (AASM)   Huston FoleySaima Minervia Osso, MD, PhD Diplomat, American Board of Neurology and Sleep Medicine (Neurology and Sleep Medicine)

## 2018-12-15 NOTE — Progress Notes (Signed)
Patient referred by Dr. Jodi Mourning, seen by me on 11/11/18 for migraine HAs, diagnostic PSG on 12/09/18.   Please call and notify the patient that the recent sleep study did not show any significant obstructive sleep apnea, except for snoring and REM related, mild OSA. Treatment with CPAP is not warranted. Avoidance of the supine sleep position and weight loss are recommended.  She can FU as scheduled with NP.  Thanks,  Star Age, MD, PhD [facility])

## 2018-12-16 ENCOUNTER — Telehealth: Payer: Self-pay

## 2018-12-16 NOTE — Telephone Encounter (Signed)
-----   Message from Star Age, MD sent at 12/15/2018  8:18 AM EDT ----- Patient referred by Dr. Jodi Mourning, seen by me on 11/11/18 for migraine HAs, diagnostic PSG on 12/09/18.   Please call and notify the patient that the recent sleep study did not show any significant obstructive sleep apnea, except for snoring and REM related, mild OSA. Treatment with CPAP is not warranted. Avoidance of the supine sleep position and weight loss are recommended.  She can FU as scheduled with NP.  Thanks,  Star Age, MD, PhD [facility])

## 2018-12-16 NOTE — Telephone Encounter (Signed)
I called pt to discuss her sleep study results. No answer, VM not set up yet. Will try again later.

## 2018-12-17 NOTE — Telephone Encounter (Signed)
I called pt and discussed her sleep study results and recommendations. I reminded her of the appt in November with Amy NP. Pt reports that her medications are going well. Pt verbalized understanding of results. Pt had no questions at this time but was encouraged to call back if questions arise.

## 2019-01-01 ENCOUNTER — Other Ambulatory Visit: Payer: Self-pay

## 2019-01-01 DIAGNOSIS — A749 Chlamydial infection, unspecified: Secondary | ICD-10-CM

## 2019-01-07 ENCOUNTER — Other Ambulatory Visit (HOSPITAL_COMMUNITY)
Admission: RE | Admit: 2019-01-07 | Discharge: 2019-01-07 | Disposition: A | Discharge status: Home / Self Care | Payer: Medicaid Other | Source: Ambulatory Visit | Attending: Family Medicine | Admitting: Family Medicine

## 2019-01-07 ENCOUNTER — Other Ambulatory Visit: Payer: Self-pay | Admitting: Family Medicine

## 2019-01-07 ENCOUNTER — Encounter: Payer: Self-pay | Admitting: Family Medicine

## 2019-01-07 ENCOUNTER — Other Ambulatory Visit: Payer: Self-pay

## 2019-01-07 ENCOUNTER — Ambulatory Visit (INDEPENDENT_AMBULATORY_CARE_PROVIDER_SITE_OTHER): Payer: Medicaid Other | Admitting: Family Medicine

## 2019-01-07 VITALS — BP 123/92 | HR 86 | Temp 98.0°F | Ht 64.0 in | Wt 192.0 lb

## 2019-01-07 DIAGNOSIS — Z0000 Encounter for general adult medical examination without abnormal findings: Secondary | ICD-10-CM | POA: Diagnosis not present

## 2019-01-07 DIAGNOSIS — Z01419 Encounter for gynecological examination (general) (routine) without abnormal findings: Secondary | ICD-10-CM | POA: Diagnosis not present

## 2019-01-07 DIAGNOSIS — F329 Major depressive disorder, single episode, unspecified: Secondary | ICD-10-CM

## 2019-01-07 DIAGNOSIS — Z23 Encounter for immunization: Secondary | ICD-10-CM

## 2019-01-07 DIAGNOSIS — N939 Abnormal uterine and vaginal bleeding, unspecified: Secondary | ICD-10-CM

## 2019-01-07 DIAGNOSIS — A749 Chlamydial infection, unspecified: Secondary | ICD-10-CM

## 2019-01-07 DIAGNOSIS — E669 Obesity, unspecified: Secondary | ICD-10-CM

## 2019-01-07 DIAGNOSIS — J351 Hypertrophy of tonsils: Secondary | ICD-10-CM

## 2019-01-07 DIAGNOSIS — R0683 Snoring: Secondary | ICD-10-CM

## 2019-01-07 DIAGNOSIS — G43019 Migraine without aura, intractable, without status migrainosus: Secondary | ICD-10-CM

## 2019-01-07 DIAGNOSIS — L309 Dermatitis, unspecified: Secondary | ICD-10-CM

## 2019-01-07 HISTORY — DX: Chlamydial infection, unspecified: A74.9

## 2019-01-07 MED ORDER — PRENATAL MULTIVITAMIN CH: 1.0000 | ORAL_TABLET | Freq: Every day | ORAL | Status: AC

## 2019-01-07 MED ORDER — TRIAMCINOLONE ACETONIDE 0.1 % EX OINT: 1.0000 "application " | TOPICAL_OINTMENT | Freq: Two times a day (BID) | CUTANEOUS | 1 refills | Status: DC

## 2019-01-07 MED ORDER — NORTRIPTYLINE HCL 10 MG PO CAPS: ORAL_CAPSULE | ORAL | 5 refills | Status: DC

## 2019-01-07 NOTE — Progress Notes (Addendum)
GYN presents for AEX/STD testing.  Declined FLU vaccine.  C/o breast pain 7/10 x1 year.  GAD-7=9

## 2019-01-07 NOTE — Progress Notes (Signed)
GYNECOLOGY ANNUAL PREVENTATIVE CARE ENCOUNTER NOTE  Subjective:   Campbell Stalludrika Normington is a 18 y.o. G0P0000 female here for a annual gynecologic exam. Current complaints: breast nipple dryness, with cracked nipples, worsening eczema.   Denies abnormal vaginal bleeding, discharge, pelvic pain, problems with intercourse or other gynecologic concerns.   She has a history of abnormal uterine bleeding, and was last seen by Dr. Clearance CootsHarper in July, where she was prescribed Norethindrone 5 mg daily for 10 days. Her periods have returned to normal.   Gynecologic History Patient's last menstrual period was 01/04/2019 (exact date). Contraception: declines, counseled on prenatal vitamin usage Last Pap: not applicable, per guidelines should start at age 721. Last mammogram: not applicable Gardisil: has not received, will start today  Obstetric History OB History  Gravida Para Term Preterm AB Living  0 0 0 0 0 0  SAB TAB Ectopic Multiple Live Births  0 0 0 0 0    No past medical history on file.  No past surgical history on file.  Current Outpatient Medications on File Prior to Visit  Medication Sig Dispense Refill  . norethindrone (AYGESTIN) 5 MG tablet Take 1 tablet (5 mg total) by mouth daily. 10 tablet 0  . rizatriptan (MAXALT) 5 MG tablet Take 1 tablet (5 mg total) by mouth as needed for migraine. May repeat in 2 h prn, no more than 2 pills/24 h, no more than 3 pills/week. 10 tablet 3   No current facility-administered medications on file prior to visit.     No Known Allergies  Social History   Socioeconomic History  . Marital status: Single    Spouse name: Not on file  . Number of children: Not on file  . Years of education: Not on file  . Highest education level: Not on file  Occupational History  . Not on file  Social Needs  . Financial resource strain: Not on file  . Food insecurity    Worry: Not on file    Inability: Not on file  . Transportation needs    Medical: Not on  file    Non-medical: Not on file  Tobacco Use  . Smoking status: Never Smoker  . Smokeless tobacco: Never Used  Substance and Sexual Activity  . Alcohol use: No  . Drug use: No  . Sexual activity: Yes    Partners: Male    Birth control/protection: None  Lifestyle  . Physical activity    Days per week: Not on file    Minutes per session: Not on file  . Stress: Not on file  Relationships  . Social Musicianconnections    Talks on phone: Not on file    Gets together: Not on file    Attends religious service: Not on file    Active member of club or organization: Not on file    Attends meetings of clubs or organizations: Not on file    Relationship status: Not on file  . Intimate partner violence    Fear of current or ex partner: Not on file    Emotionally abused: Not on file    Physically abused: Not on file    Forced sexual activity: Not on file  Other Topics Concern  . Not on file  Social History Narrative  . Not on file    Family History  Problem Relation Age of Onset  . Healthy Mother     Diet: Varied Exercise: reports that she is active  The following portions of the  patient's history were reviewed and updated as appropriate: allergies, current medications, past family history, past medical history, past social history, past surgical history and problem list.  Review of Systems Pertinent items noted in HPI and remainder of comprehensive ROS otherwise negative.   Objective:  BP (!) 123/92 (BP Location: Left Arm, Cuff Size: Normal)   Pulse 86   Temp 98 F (36.7 C)   Ht 5\' 4"  (1.626 m)   Wt 192 lb (87.1 kg)   LMP 01/04/2019 (Exact Date)   BMI 32.96 kg/m  CONSTITUTIONAL: Well-developed, well-nourished female in no acute distress.  HENT:  Normocephalic, atraumatic, External right and left ear normal. Oropharynx is clear and moist EYES: Conjunctivae and EOM are normal. Pupils are equal, round, and reactive to light. No scleral icterus.  NECK: Normal range of motion,  supple, no masses.  Normal thyroid.  SKIN: Skin is warm and dry. Areas of dryness noted, with scaling and cracking of the nipples bilaterally. There is a 1 cm max diameter, asymmetric, melanocytic, raised, mole with regular borders on the left breast at 1-2 o'clock. Not diaphoretic. No erythema. No pallor. NEUROLOGIC: Alert and oriented to person, place, and time. Normal reflexes, muscle tone coordination. No cranial nerve deficit noted. PSYCHIATRIC: Normal mood and affect. Normal behavior. Normal judgment and thought content. CARDIOVASCULAR: Normal heart rate noted, regular rhythm RESPIRATORY: Clear to auscultation bilaterally. Effort and breath sounds normal, no problems with respiration noted. BREASTS: Symmetric in size. No masses, skin changes as described above, nipple drainage, or lymphadenopathy. ABDOMEN: Soft, normal bowel sounds, no distention noted.  No tenderness, rebound or guarding.  PELVIC: Normal appearing external genitalia.  No abnormal discharge noted. Normal uterine size, no other palpable masses, no uterine or adnexal tenderness. MUSCULOSKELETAL: Normal range of motion. No tenderness.  No cyanosis, clubbing, or edema.  2+ distal pulses.  Exam done with chaperone present.  Assessment and Plan:  Will follow up results of STI screen and manage accordingly. Encouraged improvement in diet and exercise.  Flu vaccine declined today Gardisil started today  Routine preventative health maintenance measures emphasized. Please refer to After Visit Summary for other counseling recommendations.   1. Encounter for well woman exam with routine gynecological exam - HepB+HepC+HIV Panel - RPR - GC/Chlamydia probe amp ([place])not at [facility] - HIV antibody (with reflex) - Pap next year at age 51, will start Gardasil at this visit - declines contraception, will prescribe prenatal vitamins in case she gets pregnant  2. Abnormal uterine bleeding - has normal periods now  3. Need for  vaccination - HPV vaccine quadravalent 3 dose IM  4. Eczema, unspecified type - Ambulatory referral to Dermatology (also to evaluated melanocytic mole on left breast) - advised application of vaseline to areas of dryness 3-4 times daily, and use of coffee fitlers to provide a barrier between her breasts and her bra - triamcinolone ointment (KENALOG) 0.1 %; Apply 1 application topically 2 (two) times daily. Apply a think coat to affected area twice a day  Dispense: 30 g; Refill: 1  5. Major depressive disorder with current active episode, unspecified depression episode severity, unspecified whether recurrent - has started seeing primary care physician, symptoms under control with nortriptyline - nortriptyline (PAMELOR) 10 MG capsule; take 1 pill daily at bedtime for one week, then 2 pills daily at bedtime thereafter.  Dispense: 60 capsule; Refill: 5   Total face-to-face time with patient: 20 minutes. Over 50% of encounter was spent on counseling and coordination of care.   Yuvia Plant,  Dan Europe, DO OB Fellow, [facility] 01/07/2019 8:58 AM

## 2019-01-07 NOTE — Patient Instructions (Signed)

## 2019-01-08 LAB — CERVICOVAGINAL ANCILLARY ONLY
Chlamydia: POSITIVE — AB
Neisseria Gonorrhea: NEGATIVE

## 2019-01-08 MED ORDER — AZITHROMYCIN 500 MG PO TABS: 1000.0000 mg | ORAL_TABLET | Freq: Once | ORAL | Status: DC

## 2019-01-09 LAB — HEPB+HEPC+HIV PANEL
HIV Screen 4th Generation wRfx: NONREACTIVE
Hep B C IgM: NEGATIVE
Hep B Core Total Ab: NEGATIVE
Hep B E Ab: NEGATIVE
Hep B E Ag: NEGATIVE
Hep B Surface Ab, Qual: NONREACTIVE
Hep C Virus Ab: <0.1 s/co ratio (ref 0.0–0.9)
Hepatitis B Surface Ag: NEGATIVE

## 2019-01-09 LAB — RPR, QUANT+TP ABS (REFLEX)
Rapid Plasma Reagin, Quant: 1:1 {titer} — ABNORMAL HIGH
T Pallidum Abs: NONREACTIVE

## 2019-01-09 LAB — RPR: RPR Ser Ql: REACTIVE — AB

## 2019-01-12 LAB — GC/CHLAMYDIA PROBE AMP (~~LOC~~) NOT AT ARMC
Chlamydia: POSITIVE — AB
Comment: NEGATIVE
Comment: NORMAL
Neisseria Gonorrhea: NEGATIVE

## 2019-01-27 ENCOUNTER — Encounter: Payer: Self-pay | Admitting: *Deleted

## 2019-02-02 ENCOUNTER — Other Ambulatory Visit: Payer: Self-pay

## 2019-02-02 ENCOUNTER — Telehealth: Payer: Self-pay

## 2019-02-02 DIAGNOSIS — Z202 Contact with and (suspected) exposure to infections with a predominantly sexual mode of transmission: Secondary | ICD-10-CM

## 2019-02-02 MED ORDER — AZITHROMYCIN 500 MG PO TABS: 1000.0000 mg | ORAL_TABLET | Freq: Once | ORAL | 0 refills | Status: AC

## 2019-02-02 NOTE — Telephone Encounter (Signed)
Pt called regarding Rx sent for eczema pt needs a refill pt states she needs triamcinolone  1% and a larger tube due to skin irritation all over her body.

## 2019-02-02 NOTE — Telephone Encounter (Signed)
Did I send her for a derm referral? I think she needs one

## 2019-02-02 NOTE — Progress Notes (Signed)
Rx was not sent for STD  Rx sent today.  For pt to pick up pt made aware no unprotected intercourse and TOC in 4-6 wks

## 2019-02-03 NOTE — Telephone Encounter (Signed)
I messaged her on MyChart about the RPR result. Would you mind letting her know that she needs to talk to her PCP about a Derm referral? I'm not comfortable giving her more steroid cream because it can cause hypopigmentation. And she really needs to see derm

## 2019-02-03 NOTE — Telephone Encounter (Signed)
Yes. You did However with her ins she has to contact her PCP that is listed on her card and get them to do the referral.  She also had questions regarding her RPR results.

## 2019-02-03 NOTE — Telephone Encounter (Signed)
Thank you :)

## 2019-02-03 NOTE — Telephone Encounter (Signed)
Understood.  I will let her know to reach out to her PCP.

## 2019-02-10 ENCOUNTER — Ambulatory Visit: Payer: Medicaid Other

## 2019-02-11 ENCOUNTER — Encounter: Payer: Self-pay | Admitting: Family Medicine

## 2019-02-11 ENCOUNTER — Ambulatory Visit: Payer: Medicaid Other | Admitting: Family Medicine

## 2019-02-11 NOTE — Progress Notes (Deleted)
PATIENT: Patty Ramos DOB: [birth year]  REASON FOR VISIT: follow up HISTORY FROM: patient  No chief complaint on file.    HISTORY OF PRESENT ILLNESS: Today 02/11/19 [name] is a 18 y.o. female here today for follow up of headaches. Sleep study showed mild OSA but CPAP not warranted at this time. She was started on nortriptyline 10mg  and Maxalt 5mg .    HISTORY: (copied from Dr note on 11/11/2018)  Dear Dr. Teofilo Pod, I saw your patient, Patty Ramos, upon your kind request in my neurologic clinic today for initial consultation of her recurrent headaches.  The patient is unaccompanied today.  As you know, Ms. Milsap is a 18 year old right-handed woman with an underlying medical history of abnormal uterine bleeding, and obesity, who reports recurrent migrainous headaches for the past months.  I reviewed your office note from 10/02/2018.  She was started on a progestin at the time.  She believes her headache frequency has decreased since then to about 1 or 2/week as opposed to nearly daily.  She has associated photophobia and nausea, no significant vomiting.  It helps to go to bed to sleep, sometimes she has a headache at night, she has had elevated blood pressure values without a formal diagnosis of hypertension.  She does snore.  She is not aware of any family history of migraines or sleep apnea.  She has had a recent pharyngitis, treated with antibiotic medication.  She lives with a close friend.  She works in a 12.  She has a left-sided headache often, either in the front or in the temporal area on the left with a throbbing sensation.  She has tried over-the-counter Aleve or ibuprofen which does not typically help very much.  She feels that the headaches can last all day.  She has not noticed any other neurological accompaniments and denies any one-sided weakness, numbness, tingling, slurring of speech or droopy face, she has no visual symptoms, in particular, no  blurry vision, no double vision or loss of vision.  She does not drink caffeine daily, she tries to hydrate well with water.  She denies alcohol use.  She smokes about 2 cigarettes/day and smokes marijuana occasionally.   REVIEW OF SYSTEMS: Out of a complete 14 system review of symptoms, the patient complains only of the following symptoms, and all other reviewed systems are negative.  ALLERGIES: No Known Allergies  HOME MEDICATIONS: Outpatient Medications Prior to Visit  Medication Sig Dispense Refill   norethindrone (AYGESTIN) 5 MG tablet Take 1 tablet (5 mg total) by mouth daily. 10 tablet 0   nortriptyline (PAMELOR) 10 MG capsule take 1 pill daily at bedtime for one week, then 2 pills daily at bedtime thereafter. 60 capsule 5   rizatriptan (MAXALT) 5 MG tablet Take 1 tablet (5 mg total) by mouth as needed for migraine. May repeat in 2 h prn, no more than 2 pills/24 h, no more than 3 pills/week. 10 tablet 3   triamcinolone ointment (KENALOG) 0.1 % Apply 1 application topically 2 (two) times daily. Apply a think coat to affected area twice a day 30 g 1   Facility-Administered Medications Prior to Visit  Medication Dose Route Frequency Provider Last Rate Last Dose   azithromycin (ZITHROMAX) tablet 1,000 mg  1,000 mg Oral Once Sparacino, Hailey L, DO       prenatal multivitamin tablet 1 tablet  1 tablet Oral Q1200 Sparacino, Hailey L, DO        PAST MEDICAL HISTORY: Past  Medical History:  Diagnosis Date   Depression    Eczema     PAST SURGICAL HISTORY: No past surgical history on file.  FAMILY HISTORY: Family History  Problem Relation Age of Onset   Healthy Mother     SOCIAL HISTORY: Social History   Socioeconomic History   Marital status: Single    Spouse name: Not on file   Number of children: Not on file   Years of education: Not on file   Highest education level: Not on file  Occupational History   Not on file  Social Needs   Financial resource  strain: Not on file   Food insecurity    Worry: Not on file    Inability: Not on file   Transportation needs    Medical: Not on file    Non-medical: Not on file  Tobacco Use   Smoking status: Never Smoker   Smokeless tobacco: Never Used  Substance and Sexual Activity   Alcohol use: No   Drug use: No   Sexual activity: Yes    Partners: Male    Birth control/protection: None  Lifestyle   Physical activity    Days per week: Not on file    Minutes per session: Not on file   Stress: Not on file  Relationships   Social connections    Talks on phone: Not on file    Gets together: Not on file    Attends religious service: Not on file    Active member of club or organization: Not on file    Attends meetings of clubs or organizations: Not on file    Relationship status: Not on file   Intimate partner violence    Fear of current or ex partner: Not on file    Emotionally abused: Not on file    Physically abused: Not on file    Forced sexual activity: Not on file  Other Topics Concern   Not on file  Social History Narrative   Not on file      PHYSICAL EXAM  There were no vitals filed for this visit. There is no height or weight on file to calculate BMI.  Generalized: Well developed, in no acute distress  Cardiology: normal rate and rhythm, no murmur noted Neurological examination  Mentation: Alert oriented to time, place, history taking. Follows all commands speech and language fluent Cranial nerve II-XII: Pupils were equal round reactive to light. Extraocular movements were full, visual field were full on confrontational test. Facial sensation and strength were normal. Uvula tongue midline. Head turning and shoulder shrug  were normal and symmetric. Motor: The motor testing reveals 5 over 5 strength of all 4 extremities. Good symmetric motor tone is noted throughout.  Sensory: Sensory testing is intact to soft touch on all 4 extremities. No evidence of extinction  is noted.  Coordination: Cerebellar testing reveals good finger-nose-finger and heel-to-shin bilaterally.  Gait and station: Gait is normal. Tandem gait is normal. Romberg is negative. No drift is seen.  Reflexes: Deep tendon reflexes are symmetric and normal bilaterally.   DIAGNOSTIC DATA (LABS, IMAGING, TESTING) - I reviewed patient records, labs, notes, testing and imaging myself where available.  No flowsheet data found.   No results found for: WBC, HGB, HCT, MCV, PLT No results found for: NA, K, CL, CO2, GLUCOSE, BUN, CREATININE, CALCIUM, PROT, ALBUMIN, AST, ALT, ALKPHOS, BILITOT, GFRNONAA, GFRAA No results found for: CHOL, HDL, LDLCALC, LDLDIRECT, TRIG, CHOLHDL No results found for: HGBA1C No results found for:  VITAMINB12 No results found for: TSH     ASSESSMENT AND PLAN 18 y.o. year old female  has a past medical history of Depression and Eczema. here with ***    ICD-10-CM   1. Intractable migraine without aura and without status migrainosus  G43.019        No orders of the defined types were placed in this encounter.    No orders of the defined types were placed in this encounter.     I spent 15 minutes with the patient. 50% of this time was spent counseling and educating patient on plan of care and medications.    Shawnie Dappermy Nykeem Citro, FNP-C 02/11/2019, 8:11 AM [facility] [address], [address] [place], [place] [address] [phone]

## 2019-02-12 ENCOUNTER — Ambulatory Visit: Payer: Medicaid Other

## 2019-02-13 ENCOUNTER — Telehealth: Payer: Self-pay | Admitting: Obstetrics

## 2019-02-21 ENCOUNTER — Other Ambulatory Visit: Payer: Self-pay

## 2019-02-21 DIAGNOSIS — N939 Abnormal uterine and vaginal bleeding, unspecified: Secondary | ICD-10-CM

## 2019-03-09 ENCOUNTER — Ambulatory Visit: Payer: Medicaid Other | Admitting: Obstetrics

## 2019-03-09 ENCOUNTER — Ambulatory Visit: Payer: Medicaid Other

## 2019-03-23 NOTE — Progress Notes (Deleted)
PATIENT: Patty Ramos DOB: 05-03-2000  REASON FOR VISIT: follow up HISTORY FROM: patient  No chief complaint on file.    HISTORY OF PRESENT ILLNESS: Today 03/23/19 Patty Ramos is a 18 y.o. female here today for follow up for migraines. She was started on nortriptyline 10mg  at bedtime and rizatriptan for abortive therapy. Sleep study was unremarkable.    HISTORY: (copied from Dr Teofilo PodAthar's note on 11/11/2018)  Dear Dr. Clearance CootsHarper, I saw your patient, Patty Ramos, upon your kind request in my neurologic clinic today for initial consultation of her recurrent headaches.  The patient is unaccompanied today.  As you know, Patty Ramos is a 18 year old right-handed woman with an underlying medical history of abnormal uterine bleeding, and obesity, who reports recurrent migrainous headaches for the past months.  I reviewed your office note from 10/02/2018.  She was started on a progestin at the time.  She believes her headache frequency has decreased since then to about 1 or 2/week as opposed to nearly daily.  She has associated photophobia and nausea, no significant vomiting.  It helps to go to bed to sleep, sometimes she has a headache at night, she has had elevated blood pressure values without a formal diagnosis of hypertension.  She does snore.  She is not aware of any family history of migraines or sleep apnea.  She has had a recent pharyngitis, treated with antibiotic medication.  She lives with a close friend.  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  She has a left-sided headache often, either in the front or in the temporal area on the left with a throbbing sensation.  She has tried over-the-counter Aleve or ibuprofen which does not typically help very much.  She feels that the headaches can last all day.  She has not noticed any other neurological accompaniments and denies any one-sided weakness, numbness, tingling, slurring of speech or droopy face, she has no visual symptoms, in particular, no  blurry vision, no double vision or loss of vision.  She does not drink caffeine daily, she tries to hydrate well with water.  She denies alcohol use.  She smokes about 2 cigarettes/day and smokes marijuana occasionally.   REVIEW OF SYSTEMS: Out of a complete 14 system review of symptoms, the patient complains only of the following symptoms, and all other reviewed systems are negative.  ALLERGIES: No Known Allergies  HOME MEDICATIONS: Outpatient Medications Prior to Visit  Medication Sig Dispense Refill  . norethindrone (AYGESTIN) 5 MG tablet Take 1 tablet (5 mg total) by mouth daily. 10 tablet 0  . nortriptyline (PAMELOR) 10 MG capsule take 1 pill daily at bedtime for one week, then 2 pills daily at bedtime thereafter. 60 capsule 5  . rizatriptan (MAXALT) 5 MG tablet Take 1 tablet (5 mg total) by mouth as needed for migraine. May repeat in 2 h prn, no more than 2 pills/24 h, no more than 3 pills/week. 10 tablet 3  . triamcinolone ointment (KENALOG) 0.1 % Apply 1 application topically 2 (two) times daily. Apply a think coat to affected area twice a day 30 g 1   Facility-Administered Medications Prior to Visit  Medication Dose Route Frequency Provider Last Rate Last Admin  . azithromycin (ZITHROMAX) tablet 1,000 mg  1,000 mg Oral Once Sparacino, Hailey L, DO      . prenatal multivitamin tablet 1 tablet  1 tablet Oral Q1200 Sparacino, Hailey L, DO        PAST MEDICAL HISTORY: Past Medical History:  Diagnosis  Date  . Depression   . Eczema     PAST SURGICAL HISTORY: No past surgical history on file.  FAMILY HISTORY: Family History  Problem Relation Age of Onset  . Healthy Mother     SOCIAL HISTORY: Social History   Socioeconomic History  . Marital status: Single    Spouse name: Not on file  . Number of children: Not on file  . Years of education: Not on file  . Highest education level: Not on file  Occupational History  . Not on file  Tobacco Use  . Smoking status: Never  Smoker  . Smokeless tobacco: Never Used  Substance and Sexual Activity  . Alcohol use: No  . Drug use: No  . Sexual activity: Yes    Partners: Male    Birth control/protection: None  Other Topics Concern  . Not on file  Social History Narrative  . Not on file   Social Determinants of Health   Financial Resource Strain:   . Difficulty of Paying Living Expenses: Not on file  Food Insecurity:   . Worried About Programme researcher, broadcasting/film/video in the Last Year: Not on file  . Ran Out of Food in the Last Year: Not on file  Transportation Needs:   . Lack of Transportation (Medical): Not on file  . Lack of Transportation (Non-Medical): Not on file  Physical Activity:   . Days of Exercise per Week: Not on file  . Minutes of Exercise per Session: Not on file  Stress:   . Feeling of Stress : Not on file  Social Connections:   . Frequency of Communication with Friends and Family: Not on file  . Frequency of Social Gatherings with Friends and Family: Not on file  . Attends Religious Services: Not on file  . Active Member of Clubs or Organizations: Not on file  . Attends Banker Meetings: Not on file  . Marital Status: Not on file  Intimate Partner Violence:   . Fear of Current or Ex-Partner: Not on file  . Emotionally Abused: Not on file  . Physically Abused: Not on file  . Sexually Abused: Not on file      PHYSICAL EXAM  There were no vitals filed for this visit. There is no height or weight on file to calculate BMI.  Generalized: Well developed, in no acute distress  Cardiology: normal rate and rhythm, no murmur noted Neurological examination  Mentation: Alert oriented to time, place, history taking. Follows all commands speech and language fluent Cranial nerve II-XII: Pupils were equal round reactive to light. Extraocular movements were full, visual field were full on confrontational test. Facial sensation and strength were normal. Uvula tongue midline. Head turning and  shoulder shrug  were normal and symmetric. Motor: The motor testing reveals 5 over 5 strength of all 4 extremities. Good symmetric motor tone is noted throughout.  Sensory: Sensory testing is intact to soft touch on all 4 extremities. No evidence of extinction is noted.  Coordination: Cerebellar testing reveals good finger-nose-finger and heel-to-shin bilaterally.  Gait and station: Gait is normal. Tandem gait is normal. Romberg is negative. No drift is seen.  Reflexes: Deep tendon reflexes are symmetric and normal bilaterally.   DIAGNOSTIC DATA (LABS, IMAGING, TESTING) - I reviewed patient records, labs, notes, testing and imaging myself where available.  No flowsheet data found.   No results found for: WBC, HGB, HCT, MCV, PLT No results found for: NA, K, CL, CO2, GLUCOSE, BUN, CREATININE, CALCIUM, PROT,  ALBUMIN, AST, ALT, ALKPHOS, BILITOT, GFRNONAA, GFRAA No results found for: CHOL, HDL, LDLCALC, LDLDIRECT, TRIG, CHOLHDL No results found for: HGBA1C No results found for: VITAMINB12 No results found for: TSH     ASSESSMENT AND PLAN 18 y.o. year old female  has a past medical history of Depression and Eczema. here with ***    ICD-10-CM   1. Intractable migraine without aura and without status migrainosus  G43.019        No orders of the defined types were placed in this encounter.    No orders of the defined types were placed in this encounter.     I spent 15 minutes with the patient. 50% of this time was spent counseling and educating patient on plan of care and medications.    Debbora Presto, FNP-C 03/23/2019, 3:37 PM [facility] [address] [place], [place] [address] [phone]

## 2019-03-24 ENCOUNTER — Telehealth: Payer: Self-pay

## 2019-03-24 ENCOUNTER — Ambulatory Visit: Payer: Medicaid Other | Admitting: Family Medicine

## 2019-03-24 NOTE — Telephone Encounter (Signed)
Patient was a no call/no show for their appointment today.   

## 2019-04-02 ENCOUNTER — Encounter: Payer: Self-pay | Admitting: Neurology

## 2019-04-29 ENCOUNTER — Emergency Department (HOSPITAL_COMMUNITY)
Admission: EM | Admit: 2019-04-29 | Discharge: 2019-04-29 | Disposition: A | Discharge status: Home / Self Care | Payer: Medicaid Other | Attending: Emergency Medicine | Admitting: Emergency Medicine

## 2019-04-29 ENCOUNTER — Other Ambulatory Visit: Payer: Self-pay

## 2019-04-29 DIAGNOSIS — N644 Mastodynia: Secondary | ICD-10-CM | POA: Diagnosis present

## 2019-04-29 DIAGNOSIS — Z79899 Other long term (current) drug therapy: Secondary | ICD-10-CM | POA: Diagnosis not present

## 2019-04-29 DIAGNOSIS — L309 Dermatitis, unspecified: Secondary | ICD-10-CM | POA: Diagnosis not present

## 2019-04-29 MED ORDER — CEPHALEXIN 500 MG PO CAPS: 500.0000 mg | ORAL_CAPSULE | Freq: Four times a day (QID) | ORAL | 0 refills | Status: DC

## 2019-04-29 MED ORDER — MUPIROCIN 2 % EX OINT: 1.0000 "application " | TOPICAL_OINTMENT | Freq: Three times a day (TID) | CUTANEOUS | 0 refills | Status: DC

## 2019-04-29 MED ORDER — TRIAMCINOLONE ACETONIDE 0.1 % EX OINT: 1.0000 "application " | TOPICAL_OINTMENT | Freq: Two times a day (BID) | CUTANEOUS | 1 refills | Status: DC

## 2019-04-29 NOTE — ED Triage Notes (Signed)
Pt has eczema and is out of triamcinolone.   Also c/o breast tenderness, irritated and itching  that interferes with activities.

## 2019-04-29 NOTE — ED Provider Notes (Signed)
[place] EMERGENCY DEPARTMENT Provider Note   CSN: [id number] Arrival date & time: 04/29/19  1823     History Chief Complaint  Patient presents with  . Medication Refill  . Breast Problem    Patty Ramos is a 19 y.o. female.  HPI   Patient presents to the ED for evaluation of persistent breast irritation.  Patient has been seen by her OB/GYN doctor.  She has had bilateral breast imaging that did not show any acute abnormalities.  And ended up getting referred to a dermatologist.  They recommended continuing eczema medication.  Patient states she continues to have persistent breast specifically nipple tenderness and irritation.  The tissue loses.  She has to apply bandages to help prevent irritation.  Patient came to the emergency room today because her next dermatologist appointment is not for a month or so.  The irritation continues to cause difficulty with her daily life.  No fevers or chills.    Past Medical History:  Diagnosis Date  . Depression   . Eczema     There are no problems to display for this patient.   No past surgical history on file.   OB History    Gravida  0   Para  0   Term  0   Preterm  0   AB  0   Living  0     SAB  0   TAB  0   Ectopic  0   Multiple  0   Live Births  0           Family History  Problem Relation Age of Onset  . Healthy Mother     Social History   Tobacco Use  . Smoking status: Never Smoker  . Smokeless tobacco: Never Used  Substance Use Topics  . Alcohol use: No  . Drug use: No    Home Medications Prior to Admission medications   Medication Sig Start Date End Date Taking? Authorizing Provider  cephALEXin (KEFLEX) 500 MG capsule Take 1 capsule (500 mg total) by mouth 4 (four) times daily. 04/29/19   Dorie Rank, MD  mupirocin ointment (BACTROBAN) 2 % Apply 1 application topically 3 (three) times daily. To breast tissue 04/29/19   Dorie Rank, MD  norethindrone (AYGESTIN) 5 MG tablet  Take 1 tablet (5 mg total) by mouth daily. 10/02/18   Shelly Bombard, MD  nortriptyline (PAMELOR) 10 MG capsule take 1 pill daily at bedtime for one week, then 2 pills daily at bedtime thereafter. 01/07/19   Sparacino, Hailey L, DO  rizatriptan (MAXALT) 5 MG tablet Take 1 tablet (5 mg total) by mouth as needed for migraine. May repeat in 2 h prn, no more than 2 pills/24 h, no more than 3 pills/week. 11/11/18   Star Age, MD  triamcinolone ointment (KENALOG) 0.1 % Apply 1 application topically 2 (two) times daily. Apply a think coat to affected area twice a day 04/29/19   Dorie Rank, MD    Allergies    Patient has no known allergies.  Review of Systems   Review of Systems  All other systems reviewed and are negative.   Physical Exam Updated Vital Signs BP 134/85 (BP Location: Right Arm)   Pulse 83   Temp 99.5 F (37.5 C) (Oral)   Resp 18   LMP 04/18/2019   SpO2 100%   Physical Exam Vitals and nursing note reviewed. Exam conducted with a chaperone present.  Constitutional:  General: She is not in acute distress.    Appearance: She is well-developed.  HENT:     Head: Normocephalic and atraumatic.     Right Ear: External ear normal.     Left Ear: External ear normal.  Eyes:     General: No scleral icterus.       Right eye: No discharge.        Left eye: No discharge.     Conjunctiva/sclera: Conjunctivae normal.  Neck:     Trachea: No tracheal deviation.  Cardiovascular:     Rate and Rhythm: Normal rate and regular rhythm.  Pulmonary:     Effort: Pulmonary effort is normal. No respiratory distress.     Breath sounds: Normal breath sounds. No stridor. No wheezing or rales.  Chest:     Chest wall: No mass or deformity.     Comments: Bilateral breast tissue without masses, bilateral nipple erythema, oozing some serous fluid, no pustule, no bloody discharge Abdominal:     General: Bowel sounds are normal. There is no distension.     Palpations: Abdomen is soft.      Tenderness: There is no abdominal tenderness. There is no guarding or rebound.  Musculoskeletal:        General: No tenderness.     Cervical back: Neck supple.  Skin:    General: Skin is warm and dry.     Comments: Dry skin, signs of excoriation on the arms  Neurological:     Mental Status: She is alert.     Cranial Nerves: No cranial nerve deficit (no facial droop, extraocular movements intact, no slurred speech).     Sensory: No sensory deficit.     Motor: No abnormal muscle tone or seizure activity.     Coordination: Coordination normal.     ED Results / Procedures / Treatments   Labs (all labs ordered are listed, but only abnormal results are displayed) Labs Reviewed - No data to display  EKG None  Radiology No results found.  Procedures Procedures (including critical care time)  Medications Ordered in ED Medications - No data to display  ED Course  I have reviewed the triage vital signs and the nursing notes.  Pertinent labs & imaging results that were available during my care of the patient were reviewed by me and considered in my medical decision making (see chart for details).    MDM Rules/Calculators/A&P                      No breast mass appreciated.  Pt does have irritation to the nipples bilaterally.  Pt had bandaids applied.  Explained to her the adhesive from the bandaid may be causing some trouble.  She should use nonadhesive dressing over the area.  Will try oral abx and bactroban as there might be a bacterial superinfection.  Follow up with ob gyn and dermatologist. Final Clinical Impression(s) / ED Diagnoses Final diagnoses:  Nipple dermatitis    Rx / DC Orders ED Discharge Orders         Ordered    triamcinolone ointment (KENALOG) 0.1 %  2 times daily     04/29/19 2210    mupirocin ointment (BACTROBAN) 2 %  3 times daily     04/29/19 2210    cephALEXin (KEFLEX) 500 MG capsule  4 times daily     04/29/19 2210           Linwood Dibbles,  MD 04/29/19 2211

## 2019-04-29 NOTE — Discharge Instructions (Addendum)
Take the medications as prescribed, apply nonadhesive dressings for comfort, follow-up with a  primary care doctor and your dermatologist

## 2019-05-29 ENCOUNTER — Ambulatory Visit: Payer: Medicaid Other | Admitting: Internal Medicine

## 2019-06-05 ENCOUNTER — Other Ambulatory Visit: Payer: Self-pay | Admitting: Family Medicine

## 2019-06-05 DIAGNOSIS — R234 Changes in skin texture: Secondary | ICD-10-CM

## 2019-06-09 ENCOUNTER — Other Ambulatory Visit: Payer: Self-pay | Admitting: Family Medicine

## 2019-06-17 ENCOUNTER — Other Ambulatory Visit: Payer: Medicaid Other

## 2019-06-22 ENCOUNTER — Other Ambulatory Visit: Payer: Self-pay | Admitting: Family Medicine

## 2019-07-11 ENCOUNTER — Emergency Department (HOSPITAL_COMMUNITY)
Admission: EM | Admit: 2019-07-11 | Discharge: 2019-07-11 | Disposition: A | Discharge status: Home / Self Care | Payer: Medicaid Other | Source: Home / Self Care | Attending: Emergency Medicine | Admitting: Emergency Medicine

## 2019-07-11 ENCOUNTER — Encounter (HOSPITAL_COMMUNITY): Payer: Self-pay

## 2019-07-11 ENCOUNTER — Encounter (HOSPITAL_COMMUNITY): Payer: Self-pay | Admitting: Emergency Medicine

## 2019-07-11 ENCOUNTER — Emergency Department (HOSPITAL_COMMUNITY)
Admission: EM | Admit: 2019-07-11 | Discharge: 2019-07-11 | Disposition: A | Discharge status: Home / Self Care | Payer: Medicaid Other | Attending: Emergency Medicine | Admitting: Emergency Medicine

## 2019-07-11 ENCOUNTER — Other Ambulatory Visit: Payer: Self-pay

## 2019-07-11 DIAGNOSIS — Y929 Unspecified place or not applicable: Secondary | ICD-10-CM | POA: Diagnosis not present

## 2019-07-11 DIAGNOSIS — W269XXA Contact with unspecified sharp object(s), initial encounter: Secondary | ICD-10-CM | POA: Diagnosis not present

## 2019-07-11 DIAGNOSIS — Y939 Activity, unspecified: Secondary | ICD-10-CM | POA: Diagnosis not present

## 2019-07-11 DIAGNOSIS — Y999 Unspecified external cause status: Secondary | ICD-10-CM | POA: Diagnosis not present

## 2019-07-11 DIAGNOSIS — S0181XA Laceration without foreign body of other part of head, initial encounter: Secondary | ICD-10-CM

## 2019-07-11 DIAGNOSIS — Z5321 Procedure and treatment not carried out due to patient leaving prior to being seen by health care provider: Secondary | ICD-10-CM | POA: Diagnosis not present

## 2019-07-11 DIAGNOSIS — L309 Dermatitis, unspecified: Secondary | ICD-10-CM

## 2019-07-11 MED ORDER — TETANUS-DIPHTH-ACELL PERTUSSIS 5-2.5-18.5 LF-MCG/0.5 IM SUSP
0.5000 mL | Freq: Once | INTRAMUSCULAR | Status: AC
  Administered 2019-07-11: 19:00:00 0.5 mL via INTRAMUSCULAR
  Filled 2019-07-11: qty 0.5

## 2019-07-11 MED ORDER — ACETAMINOPHEN 500 MG PO TABS
1000.0000 mg | ORAL_TABLET | Freq: Once | ORAL | Status: AC
  Administered 2019-07-11: 20:00:00 1000 mg via ORAL
  Filled 2019-07-11: qty 2

## 2019-07-11 MED ORDER — LIDOCAINE-EPINEPHRINE 2 %-1:100000 IJ SOLN
20.0000 mL | Freq: Once | INTRAMUSCULAR | Status: AC
  Administered 2019-07-11: 20:00:00 20 mL via INTRADERMAL
  Filled 2019-07-11: qty 1

## 2019-07-11 MED ORDER — TRIAMCINOLONE ACETONIDE 0.025 % EX OINT: 1.0000 "application " | TOPICAL_OINTMENT | Freq: Two times a day (BID) | CUTANEOUS | 0 refills | Status: DC

## 2019-07-11 MED ORDER — LEVOCETIRIZINE DIHYDROCHLORIDE 5 MG PO TABS: 5.0000 mg | ORAL_TABLET | Freq: Every evening | ORAL | 0 refills | Status: DC
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
## 2019-07-11 NOTE — Discharge Instructions (Signed)
WOUND CARE Your sutures will dissolve within 1 week.  Keep area clean and dry for 24 hours. Do not remove bandage, if applied.  After 24 hours, remove bandage and wash wound gently with mild soap and warm water. Reapply a new bandage after cleaning wound, if directed.  Continue daily cleansing with soap and water until stitches/staples are removed.  Do not apply any ointments or creams to the wound while stitches/staples are in place, as this may cause delayed healing.  Seek medical careif you experience any of the following signs of infection: Swelling, redness, pus drainage, streaking, fever >101.0 F  Seek care if you experience excessive bleeding that does not stop after 15-20 minutes of constant, firm pressure.
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
## 2019-08-10 ENCOUNTER — Other Ambulatory Visit: Payer: Self-pay

## 2019-08-10 ENCOUNTER — Ambulatory Visit (HOSPITAL_COMMUNITY)
Admission: EM | Admit: 2019-08-10 | Discharge: 2019-08-10 | Disposition: A | Discharge status: Home / Self Care | Payer: Medicaid Other | Attending: Family Medicine | Admitting: Family Medicine

## 2019-08-10 ENCOUNTER — Encounter (HOSPITAL_COMMUNITY): Payer: Self-pay

## 2019-08-10 DIAGNOSIS — Z3202 Encounter for pregnancy test, result negative: Secondary | ICD-10-CM

## 2019-08-10 DIAGNOSIS — N939 Abnormal uterine and vaginal bleeding, unspecified: Secondary | ICD-10-CM | POA: Insufficient documentation

## 2019-08-10 LAB — CBC
HCT: 36.8 % (ref 36.0–46.0)
Hemoglobin: 11.9 g/dL — ABNORMAL LOW (ref 12.0–15.0)
MCH: 25.3 pg — ABNORMAL LOW (ref 26.0–34.0)
MCHC: 32.3 g/dL (ref 30.0–36.0)
MCV: 78.3 fL — ABNORMAL LOW (ref 80.0–100.0)
Platelets: 276 10*3/uL (ref 150–400)
RBC: 4.7 MIL/uL (ref 3.87–5.11)
RDW: 14.2 % (ref 11.5–15.5)
WBC: 5.1 10*3/uL (ref 4.0–10.5)
nRBC: 0 % (ref 0.0–0.2)

## 2019-08-10 LAB — POCT URINALYSIS DIP (DEVICE)
Bilirubin Urine: NEGATIVE
Glucose, UA: NEGATIVE mg/dL
Ketones, ur: NEGATIVE mg/dL
Leukocytes,Ua: NEGATIVE
Nitrite: NEGATIVE
Protein, ur: 100 mg/dL — AB
Specific Gravity, Urine: 1.025 (ref 1.005–1.030)
Urobilinogen, UA: 0.2 mg/dL (ref 0.0–1.0)
pH: 5.5 (ref 5.0–8.0)

## 2019-08-10 LAB — POC URINE PREG, ED: Preg Test, Ur: NEGATIVE

## 2019-08-10 MED ORDER — KETOROLAC TROMETHAMINE 30 MG/ML IJ SOLN
30.0000 mg | Freq: Once | INTRAMUSCULAR | Status: AC
  Administered 2019-08-10: 30 mg via INTRAMUSCULAR

## 2019-08-10 MED ORDER — KETOROLAC TROMETHAMINE 30 MG/ML IJ SOLN
INTRAMUSCULAR | Status: AC
  Filled 2019-08-10: qty 1

## 2019-08-10 MED ORDER — MEGESTROL ACETATE 40 MG PO TABS: 40.0000 mg | ORAL_TABLET | Freq: Two times a day (BID) | ORAL | 0 refills | Status: DC

## 2019-08-10 NOTE — ED Provider Notes (Signed)
[facility]    CSN: [id number] Arrival date & time: 08/10/19  3244      History   Chief Complaint Chief Complaint  Patient presents with   Vaginal Bleeding    HPI Patty Ramos is a 19 y.o. female.   Patient is a 19 year old female presents today with heavy, continuous vaginal bleeding over the past 2 months.  Symptoms have been constant.  She has been passing clots at times.  Generalized lower abdominal cramping that waxes and wanes.  Has not been taking anything for her symptoms.  Was previously on birth control pills and Depo-Provera.  Stopped the oral birth control 3 months ago.  Patient reports she is trying to conceive.  Feels a little lightheaded and fatigued at times concerned for anemia.  Denies any specific discharge, odor from the vaginal area.  ROS per HPI      Past Medical History:  Diagnosis Date   Depression    Eczema     There are no problems to display for this patient.   History reviewed. No pertinent surgical history.  OB History    Gravida  0   Para  0   Term  0   Preterm  0   AB  0   Living  0     SAB  0   TAB  0   Ectopic  0   Multiple  0   Live Births  0            Home Medications    Prior to Admission medications   Medication Sig Start Date End Date Taking? Authorizing Provider  levocetirizine (XYZAL) 5 MG tablet Take 1 tablet (5 mg total) by mouth every evening. 07/11/19   Arthor Captain, PA-C  megestrol (MEGACE) 40 MG tablet Take 1 tablet (40 mg total) by mouth 2 (two) times daily. 08/10/19   Dahlia Byes A, NP  mupirocin ointment (BACTROBAN) 2 % Apply 1 application topically 3 (three) times daily. To breast tissue 04/29/19   Linwood Dibbles, MD  NORTREL 7/7/7 0.5/0.75/1-35 MG-MCG tablet Take 1 tablet by mouth daily. 02/23/19   [provider]  triamcinolone (KENALOG) 0.025 % ointment Apply 1 application topically 2 (two) times daily. 07/11/19   Arthor Captain, PA-C  triamcinolone ointment (KENALOG)  0.1 % Apply 1 application topically 2 (two) times daily. Apply a think coat to affected area twice a day 04/29/19   Linwood Dibbles, MD  nortriptyline (PAMELOR) 10 MG capsule take 1 pill daily at bedtime for one week, then 2 pills daily at bedtime thereafter. 01/07/19 08/10/19  Sparacino, Margarette Asal, DO    Family History Family History  Problem Relation Age of Onset   Healthy Mother     Social History Social History   Tobacco Use   Smoking status: Never Smoker   Smokeless tobacco: Never Used  Substance Use Topics   Alcohol use: No   Drug use: No     Allergies   Ibuprofen and Tylenol [acetaminophen]   Review of Systems Review of Systems   Physical Exam Triage Vital Signs ED Triage Vitals  Enc Vitals Group     BP 08/10/19 0949 137/85     Pulse Rate 08/10/19 0949 85     Resp 08/10/19 0949 16     Temp 08/10/19 0949 98.6 F (37 C)     Temp Source 08/10/19 0949 Oral     SpO2 08/10/19 0949 100 %     Weight --  Height --      Head Circumference --      Peak Flow --      Pain Score 08/10/19 0945 10     Pain Loc --      Pain Edu? --      Excl. in [place]? --    No data found.  Updated Vital Signs BP 137/85 (BP Location: Right Arm)    Pulse 85    Temp 98.6 F (37 C) (Oral)    Resp 16    SpO2 100%   Visual Acuity Right Eye Distance:   Left Eye Distance:   Bilateral Distance:    Right Eye Near:   Left Eye Near:    Bilateral Near:     Physical Exam Vitals and nursing note reviewed.  Constitutional:      General: She is not in acute distress.    Appearance: Normal appearance. She is not ill-appearing, toxic-appearing or diaphoretic.  HENT:     Head: Normocephalic.     Nose: Nose normal.  Eyes:     Conjunctiva/sclera: Conjunctivae normal.  Pulmonary:     Effort: Pulmonary effort is normal.  Abdominal:     Comments: Generalized lower abdominal tenderness.   Musculoskeletal:        General: Normal range of motion.     Cervical back: Normal range of motion.    Skin:    General: Skin is warm and dry.     Findings: No rash.  Neurological:     Mental Status: She is alert.  Psychiatric:        Mood and Affect: Mood normal.      UC Treatments / Results  Labs (all labs ordered are listed, but only abnormal results are displayed) Labs Reviewed  POCT URINALYSIS DIP (DEVICE) - Abnormal; Notable for the following components:      Result Value   Hgb urine dipstick LARGE (*)    Protein, ur 100 (*)    All other components within normal limits  CBC  POC URINE PREG, ED  CERVICOVAGINAL ANCILLARY ONLY    EKG   Radiology No results found.  Procedures Procedures (including critical care time)  Medications Ordered in UC Medications  ketorolac (TORADOL) 30 MG/ML injection 30 mg (30 mg Intramuscular Given 08/10/19 1041)    Initial Impression / Assessment and Plan / UC Course  I have reviewed the triage vital signs and the nursing notes.  Pertinent labs & imaging results that were available during my care of the patient were reviewed by me and considered in my medical decision making (see chart for details).     Abnormal vaginal bleeding, most likely hormonal Checking CBC for anemia based on length of bleeding and symptoms Checking swab for infection. We will start on Megace to stop bleeding Pregnancy test negative here. Toradol given here for pain Contact given for OB/GYN follow-up Final Clinical Impressions(s) / UC Diagnoses   Final diagnoses:  Abnormal vaginal bleeding     Discharge Instructions     Giving you medication to stop the bleeding Checking you for anemia Toradol here for pain  Recommend ibuprofen or aleve for pain Contacts given for OB/GYN follow-up    ED Prescriptions    Medication Sig Dispense Auth. Provider   megestrol (MEGACE) 40 MG tablet Take 1 tablet (40 mg total) by mouth 2 (two) times daily. 20 tablet Loura Halt A, NP     PDMP not reviewed this encounter.   Loura Halt A, NP 08/10/19 1046

## 2019-08-10 NOTE — Discharge Instructions (Addendum)
Giving you medication to stop the bleeding Checking you for anemia Toradol here for pain  Recommend ibuprofen or aleve for pain Contacts given for OB/GYN follow-up

## 2019-08-10 NOTE — ED Triage Notes (Signed)
Pt c/o heavy vaginal bleeding and cramping for approx 2 months. Reports passing clots as well.  Also requests re-eval of wound/suture area to forehead.  Stopped BCP approx 3 months ago.

## 2019-08-11 ENCOUNTER — Telehealth (HOSPITAL_COMMUNITY): Payer: Self-pay | Admitting: Orthopedic Surgery

## 2019-08-11 LAB — CERVICOVAGINAL ANCILLARY ONLY
Bacterial Vaginitis (gardnerella): POSITIVE — AB
Candida Glabrata: NEGATIVE
Candida Vaginitis: POSITIVE — AB
Chlamydia: NEGATIVE
Comment: NEGATIVE
Comment: NEGATIVE
Comment: NEGATIVE
Comment: NEGATIVE
Comment: NEGATIVE
Comment: NORMAL
Neisseria Gonorrhea: NEGATIVE
Trichomonas: NEGATIVE

## 2019-08-11 MED ORDER — METRONIDAZOLE 500 MG PO TABS: 500.0000 mg | ORAL_TABLET | Freq: Two times a day (BID) | ORAL | 0 refills | Status: DC

## 2019-08-11 MED ORDER — FLUCONAZOLE 150 MG PO TABS: 150.0000 mg | ORAL_TABLET | Freq: Every day | ORAL | 0 refills | Status: AC

## 2019-08-12 ENCOUNTER — Ambulatory Visit (INDEPENDENT_AMBULATORY_CARE_PROVIDER_SITE_OTHER): Payer: Medicaid Other | Admitting: Certified Nurse Midwife

## 2019-08-12 ENCOUNTER — Other Ambulatory Visit: Payer: Self-pay

## 2019-08-12 ENCOUNTER — Encounter: Payer: Self-pay | Admitting: Certified Nurse Midwife

## 2019-08-12 VITALS — BP 128/77 | HR 76 | Wt 185.0 lb

## 2019-08-12 DIAGNOSIS — R102 Pelvic and perineal pain: Secondary | ICD-10-CM | POA: Diagnosis not present

## 2019-08-12 DIAGNOSIS — N939 Abnormal uterine and vaginal bleeding, unspecified: Secondary | ICD-10-CM | POA: Diagnosis not present

## 2019-08-12 NOTE — Progress Notes (Signed)
History:  Ms. Patty Ramos is a 19 y.o. G0P0000 who presents to clinic today for abnormal vaginal bleeding. Patient reports that she has been having AUB for the past 2-3 months, was seen in the urgent care for complaint on 5/17 and was given megace but also treatment for yeast infection. Patient reports she is on Nortrel, but was told that she can take it as needed.   The following portions of the patient's history were reviewed and updated as appropriate: allergies, current medications, family history, past medical history, social history, past surgical history and problem list.  Review of Systems:  Review of Systems  Constitutional: Negative.   Respiratory: Negative.   Cardiovascular: Negative.   Gastrointestinal: Negative.   Genitourinary:       Pelvic pain and vaginal bleeding  Neurological: Negative.   Psychiatric/Behavioral: Negative.      Objective:  Physical Exam BP 128/77   Pulse 76   Wt 185 lb (83.9 kg)   BMI 32.77 kg/m  Physical Exam Vitals and nursing note reviewed.  HENT:     Head: Normocephalic.  Cardiovascular:     Rate and Rhythm: Normal rate and regular rhythm.  Pulmonary:     Effort: Pulmonary effort is normal. No respiratory distress.     Breath sounds: Normal breath sounds. No wheezing.  Abdominal:     General: Bowel sounds are normal. There is no distension.     Palpations: Abdomen is soft. There is no mass.     Tenderness: There is abdominal tenderness. There is no guarding.     Comments: Tenderness with palpation suprapubric and left lower quadrant   Genitourinary:    Comments: Deferred d/t recent pelvic examination at urgent care  Skin:    General: Skin is warm and dry.  Neurological:     Mental Status: She is alert and oriented to person, place, and time.  Psychiatric:        Mood and Affect: Mood normal.        Behavior: Behavior normal.        Thought Content: Thought content normal.    Assessment & Plan:  1. Abnormal uterine bleeding  (AUB) - Educated and discussed with patient that birth control is not be taken as needed, it is prescribed to be taken consistently. Patient reports that she does not want to be on birth control because she is actively wanting to conceive, discussed with patient to stop use of birth control which can help AUB as she was taking it irregular and not as prescribed  - Educated and discussed with patient labs and ultrasound to assess for hormone or physical abnormalities that can be cause pain and AUB  - TSH - HgB A1c - Testosterone - Prolactin  2. Pelvic pain in female - US Pelvis Complete; Future   Sharyon Cable, CNM 08/12/2019 3:07 PM

## 2019-08-12 NOTE — Progress Notes (Signed)
Pt states she has been having pelvic pain x 2-3 months. Pt states she has had prolonged bleeding as well.  No bleeding today.  Pt was seen at urgent care on 5/17 - please review notes - +BV/yeast. Was also given Tramadol inj and Megace. Pt thinks she may have had reaction to medication - dizziness and faint feeling.

## 2019-08-13 LAB — HEMOGLOBIN A1C
Est. average glucose Bld gHb Est-mCnc: 105 mg/dL
Hgb A1c MFr Bld: 5.3 % (ref 4.8–5.6)

## 2019-08-13 LAB — TESTOSTERONE: Testosterone: 17 ng/dL (ref 13–71)

## 2019-08-13 LAB — TSH: TSH: 1.33 u[IU]/mL (ref 0.450–4.500)

## 2019-08-13 LAB — PROLACTIN: Prolactin: 13.5 ng/mL (ref 4.8–23.3)

## 2019-08-19 ENCOUNTER — Other Ambulatory Visit: Payer: Self-pay

## 2019-08-19 ENCOUNTER — Ambulatory Visit
Admission: RE | Admit: 2019-08-19 | Discharge: 2019-08-19 | Disposition: A | Discharge status: Home / Self Care | Payer: Medicaid Other | Source: Ambulatory Visit | Attending: Certified Nurse Midwife | Admitting: Certified Nurse Midwife

## 2019-08-19 DIAGNOSIS — R102 Pelvic and perineal pain: Secondary | ICD-10-CM | POA: Diagnosis not present

## 2019-08-20 ENCOUNTER — Telehealth: Payer: Self-pay | Admitting: Certified Nurse Midwife

## 2019-08-20 NOTE — Telephone Encounter (Signed)
Patient called to discuss ultrasound results. Discussed presence of simple cyst but otherwise normal ultrasound. Encouraged use of tylenol or ibuprofen for pelvic pain as needed. Answered patient's questions.    Sharyon Cable, CNM 08/20/19, 9:03 AM

## 2019-09-12 ENCOUNTER — Ambulatory Visit (HOSPITAL_COMMUNITY)
Admission: EM | Admit: 2019-09-12 | Discharge: 2019-09-12 | Disposition: A | Discharge status: Home / Self Care | Payer: Medicaid Other | Attending: Family Medicine | Admitting: Family Medicine

## 2019-09-12 ENCOUNTER — Ambulatory Visit (INDEPENDENT_AMBULATORY_CARE_PROVIDER_SITE_OTHER): Payer: Medicaid Other

## 2019-09-12 ENCOUNTER — Other Ambulatory Visit: Payer: Self-pay

## 2019-09-12 ENCOUNTER — Encounter (HOSPITAL_COMMUNITY): Payer: Self-pay | Admitting: Emergency Medicine

## 2019-09-12 DIAGNOSIS — S93105A Unspecified dislocation of left toe(s), initial encounter: Secondary | ICD-10-CM

## 2019-09-12 DIAGNOSIS — S99922A Unspecified injury of left foot, initial encounter: Secondary | ICD-10-CM

## 2019-09-12 DIAGNOSIS — M79672 Pain in left foot: Secondary | ICD-10-CM | POA: Diagnosis not present

## 2019-09-12 DIAGNOSIS — S93115A Dislocation of interphalangeal joint of left lesser toe(s), initial encounter: Secondary | ICD-10-CM | POA: Diagnosis not present

## 2019-09-12 DIAGNOSIS — S91114A Laceration without foreign body of right lesser toe(s) without damage to nail, initial encounter: Secondary | ICD-10-CM | POA: Diagnosis not present

## 2019-09-12 MED ORDER — TRAMADOL HCL 50 MG PO TABS: 50.0000 mg | ORAL_TABLET | Freq: Four times a day (QID) | ORAL | 0 refills | Status: DC | PRN

## 2019-09-12 NOTE — ED Triage Notes (Signed)
Pt here with pain to left foot after slipping at pool yesterday; pt sts pain around toe area

## 2019-09-12 NOTE — Discharge Instructions (Addendum)
Be aware, you have been prescribed pain medications that may cause drowsiness. Do not combine with alcohol or other illicit drugs. Please do not drive, operate heavy machinery, or take part in activities that require making important decisions while on this medication as your judgement may be clouded.  

## 2019-09-14 NOTE — ED Provider Notes (Signed)
[facility]   [phone] 09/12/19 Arrival Time: 7824  ASSESSMENT & PLAN:  1. Foot pain, left   2. Toe dislocation, left, initial encounter   3. Laceration of fourth toe of right foot, initial encounter     Meds ordered this encounter  Medications  . traMADol (ULTRAM) 50 MG tablet    Sig: Take 1 tablet (50 mg total) by mouth every 6 (six) hours as needed.    Dispense:  12 tablet    Refill:  0   Placed in post-op shoe. Crutches provided.  Procedure: Verbal consent obtained. Patient provided with risks and alternatives to the procedure. Wound copiously irrigated with NS then cleansed with betadine. Digital block performed with Lidocaine 1% without epinephrine. Wound carefully explored. No foreign body, tendon injury, or nonviable tissue were noted. Using sterile technique, 1 interrupted 5-0 Prolene suture placed to reapproximate the wound. Procedure tolerated well. No complications. Minimal bleeding. Advised to look for and return for any signs of infection such as redness, swelling, discharge, or worsening pain. Return for suture removal in 7 days here; may have removed by orthopaedist as she is going to arrange f/u.  I have personally viewed the imaging studies ordered this visit. Left 4th toe dislocation without appreciated fracture. . Procedure: Digital block of left 4th toe. Attempted reduction of dislocation. Unsuccessful. Will have her f/u with orthopaedist. Neuro/vasc intact.   Discharge Instructions     Be aware, you have been prescribed pain medications that may cause drowsiness. Do not combine with alcohol or other illicit drugs. Please do not drive, operate heavy machinery, or take part in activities that require making important decisions while on this medication as your judgement may be clouded.      [place] Controlled Substances Registry consulted for this patient. I feel the risk/benefit ratio today is favorable for proceeding with this prescription for a  controlled substance. Medication sedation precautions given.   Reviewed expectations re: course of current medical issues. Questions answered. Outlined signs and symptoms indicating need for more acute intervention. Patient verbalized understanding. After Visit Summary given.   SUBJECTIVE:  Patty Ramos is a 19 y.o. female who presents with a laceration/injury of her left 4th toe/foot. Reports hitting foot on pool last evening. Continued pain. Bleeding from cut toe. Able to bear weight but painful. No extremity sensation changes.   Td UTD: Feels so.  ROS: As per HPI. Health Maintenance Due  Topic Date Due  . COVID-19 Vaccine (1) Never done    OBJECTIVE:  Vitals:   09/12/19 1228  BP: (!) 144/85  Pulse: 96  Resp: 18  Temp: 98.2 F (36.8 C)  TempSrc: Oral  SpO2: 98%     General appearance: alert; no distress but appears uncomfortable Skin: linear laceration of inferior left 4th toe; size: approx 0.5 cm; clean wound edges, no foreign bodies; with active bleeding (controlled upon suture placement) Left foot: very tender around 4th toe; moderate swelling without bruising; distal sensation intact; normal capillary refill Psychological: alert and cooperative; normal mood and affect   Allergies  Allergen Reactions  . Ibuprofen Rash  . Tylenol [Acetaminophen] Rash    Past Medical History:  Diagnosis Date  . Depression   . Eczema    Social History   Socioeconomic History  . Marital status: Single    Spouse name: Not on file  . Number of children: Not on file  . Years of education: Not on file  . Highest education level: Not on file  Occupational History  .  Not on file  Tobacco Use  . Smoking status: Never Smoker  . Smokeless tobacco: Never Used  Vaping Use  . Vaping Use: Never used  Substance and Sexual Activity  . Alcohol use: No  . Drug use: No  . Sexual activity: Yes    Partners: Male    Birth control/protection: None  Other Topics Concern  . Not on  file  Social History Narrative  . Not on file   Social Determinants of Health   Financial Resource Strain:   . Difficulty of Paying Living Expenses:   Food Insecurity:   . Worried About Programme researcher, broadcasting/film/video in the Last Year:   . Barista in the Last Year:   Transportation Needs:   . Freight forwarder (Medical):   Marland Kitchen Lack of Transportation (Non-Medical):   Physical Activity:   . Days of Exercise per Week:   . Minutes of Exercise per Session:   Stress:   . Feeling of Stress :   Social Connections:   . Frequency of Communication with Friends and Family:   . Frequency of Social Gatherings with Friends and Family:   . Attends Religious Services:   . Active Member of Clubs or Organizations:   . Attends Banker Meetings:   Marland Kitchen Marital Status:          Mardella Layman, MD 09/14/19 [phone]

## 2019-09-16 ENCOUNTER — Other Ambulatory Visit (HOSPITAL_COMMUNITY)
Admission: RE | Admit: 2019-09-16 | Discharge: 2019-09-16 | Disposition: A | Discharge status: Home / Self Care | Payer: Medicaid Other | Source: Ambulatory Visit | Attending: Orthopaedic Surgery | Admitting: Orthopaedic Surgery

## 2019-09-16 ENCOUNTER — Other Ambulatory Visit: Payer: Self-pay | Admitting: Orthopaedic Surgery

## 2019-09-16 ENCOUNTER — Encounter (HOSPITAL_BASED_OUTPATIENT_CLINIC_OR_DEPARTMENT_OTHER): Payer: Self-pay | Admitting: Orthopaedic Surgery

## 2019-09-16 DIAGNOSIS — Z01812 Encounter for preprocedural laboratory examination: Secondary | ICD-10-CM | POA: Insufficient documentation

## 2019-09-16 DIAGNOSIS — Z20822 Contact with and (suspected) exposure to covid-19: Secondary | ICD-10-CM | POA: Diagnosis not present

## 2019-09-16 LAB — SARS CORONAVIRUS 2 (TAT 6-24 HRS): SARS Coronavirus 2: NEGATIVE

## 2019-09-16 NOTE — Progress Notes (Signed)

## 2019-09-17 ENCOUNTER — Ambulatory Visit (HOSPITAL_BASED_OUTPATIENT_CLINIC_OR_DEPARTMENT_OTHER): Payer: Medicaid Other | Admitting: Anesthesiology

## 2019-09-17 ENCOUNTER — Ambulatory Visit (HOSPITAL_BASED_OUTPATIENT_CLINIC_OR_DEPARTMENT_OTHER)
Admission: RE | Admit: 2019-09-17 | Discharge: 2019-09-17 | Disposition: A | Discharge status: Home / Self Care | Payer: Medicaid Other | Attending: Orthopaedic Surgery | Admitting: Orthopaedic Surgery

## 2019-09-17 ENCOUNTER — Other Ambulatory Visit: Payer: Self-pay

## 2019-09-17 ENCOUNTER — Encounter (HOSPITAL_BASED_OUTPATIENT_CLINIC_OR_DEPARTMENT_OTHER)
Admission: RE | Disposition: A | Discharge status: Home / Self Care | Payer: Self-pay | Source: Home / Self Care | Attending: Orthopaedic Surgery

## 2019-09-17 ENCOUNTER — Encounter (HOSPITAL_BASED_OUTPATIENT_CLINIC_OR_DEPARTMENT_OTHER): Payer: Self-pay | Admitting: Orthopaedic Surgery

## 2019-09-17 DIAGNOSIS — F329 Major depressive disorder, single episode, unspecified: Secondary | ICD-10-CM | POA: Insufficient documentation

## 2019-09-17 DIAGNOSIS — S93115D Dislocation of interphalangeal joint of left lesser toe(s), subsequent encounter: Secondary | ICD-10-CM | POA: Insufficient documentation

## 2019-09-17 DIAGNOSIS — Y939 Activity, unspecified: Secondary | ICD-10-CM | POA: Diagnosis not present

## 2019-09-17 DIAGNOSIS — S91115D Laceration without foreign body of left lesser toe(s) without damage to nail, subsequent encounter: Secondary | ICD-10-CM | POA: Insufficient documentation

## 2019-09-17 DIAGNOSIS — Z79899 Other long term (current) drug therapy: Secondary | ICD-10-CM | POA: Diagnosis not present

## 2019-09-17 DIAGNOSIS — Z886 Allergy status to analgesic agent status: Secondary | ICD-10-CM | POA: Diagnosis not present

## 2019-09-17 DIAGNOSIS — X58XXXD Exposure to other specified factors, subsequent encounter: Secondary | ICD-10-CM | POA: Insufficient documentation

## 2019-09-17 HISTORY — PX: ORIF TOE FRACTURE: SHX5032

## 2019-09-17 HISTORY — PX: I & D EXTREMITY: SHX5045

## 2019-09-17 LAB — POCT PREGNANCY, URINE: Preg Test, Ur: NEGATIVE

## 2019-09-17 SURGERY — OPEN REDUCTION INTERNAL FIXATION (ORIF) METATARSAL (TOE) FRACTURE
Anesthesia: General | Site: Toe | Laterality: Left

## 2019-09-17 MED ORDER — DEXAMETHASONE SODIUM PHOSPHATE 10 MG/ML IJ SOLN
INTRAMUSCULAR | Status: DC | PRN
  Administered 2019-09-17: 10 mg via INTRAVENOUS

## 2019-09-17 MED ORDER — ONDANSETRON HCL 4 MG/2ML IJ SOLN
INTRAMUSCULAR | Status: AC
  Filled 2019-09-17: qty 2

## 2019-09-17 MED ORDER — OXYCODONE HCL 5 MG PO TABS
5.0000 mg | ORAL_TABLET | Freq: Once | ORAL | Status: AC | PRN
  Administered 2019-09-17: 5 mg via ORAL

## 2019-09-17 MED ORDER — LACTATED RINGERS IV SOLN: INTRAVENOUS | Status: DC

## 2019-09-17 MED ORDER — HYDROMORPHONE HCL 1 MG/ML IJ SOLN
INTRAMUSCULAR | Status: AC
  Filled 2019-09-17: qty 0.5

## 2019-09-17 MED ORDER — TRAMADOL HCL 50 MG PO TABS: 50.0000 mg | ORAL_TABLET | Freq: Four times a day (QID) | ORAL | 0 refills | Status: AC | PRN

## 2019-09-17 MED ORDER — 0.9 % SODIUM CHLORIDE (POUR BTL) OPTIME
TOPICAL | Status: DC | PRN
  Administered 2019-09-17: 200 mL

## 2019-09-17 MED ORDER — PROPOFOL 10 MG/ML IV BOLUS
INTRAVENOUS | Status: AC
  Filled 2019-09-17: qty 20

## 2019-09-17 MED ORDER — HYDROMORPHONE HCL 1 MG/ML IJ SOLN
0.2500 mg | INTRAMUSCULAR | Status: DC | PRN
  Administered 2019-09-17 (×2): 0.5 mg via INTRAVENOUS

## 2019-09-17 MED ORDER — LIDOCAINE 2% (20 MG/ML) 5 ML SYRINGE
INTRAMUSCULAR | Status: AC
  Filled 2019-09-17: qty 5

## 2019-09-17 MED ORDER — MIDAZOLAM HCL 2 MG/2ML IJ SOLN
INTRAMUSCULAR | Status: AC
  Filled 2019-09-17: qty 2

## 2019-09-17 MED ORDER — OXYCODONE HCL 5 MG PO TABS
ORAL_TABLET | ORAL | Status: AC
  Filled 2019-09-17: qty 1

## 2019-09-17 MED ORDER — FENTANYL CITRATE (PF) 100 MCG/2ML IJ SOLN
INTRAMUSCULAR | Status: DC | PRN
  Administered 2019-09-17: 50 ug via INTRAVENOUS
  Administered 2019-09-17: 25 ug via INTRAVENOUS

## 2019-09-17 MED ORDER — MIDAZOLAM HCL 5 MG/5ML IJ SOLN
INTRAMUSCULAR | Status: DC | PRN
  Administered 2019-09-17: 2 mg via INTRAVENOUS

## 2019-09-17 MED ORDER — PROPOFOL 10 MG/ML IV BOLUS
INTRAVENOUS | Status: DC | PRN
  Administered 2019-09-17: 200 mg via INTRAVENOUS

## 2019-09-17 MED ORDER — LIDOCAINE HCL (CARDIAC) PF 100 MG/5ML IV SOSY
PREFILLED_SYRINGE | INTRAVENOUS | Status: DC | PRN
  Administered 2019-09-17: 60 mg via INTRAVENOUS

## 2019-09-17 MED ORDER — CEFAZOLIN SODIUM-DEXTROSE 2-4 GM/100ML-% IV SOLN
2.0000 g | INTRAVENOUS | Status: AC
  Administered 2019-09-17: 2 g via INTRAVENOUS

## 2019-09-17 MED ORDER — FENTANYL CITRATE (PF) 100 MCG/2ML IJ SOLN
INTRAMUSCULAR | Status: AC
  Filled 2019-09-17: qty 2

## 2019-09-17 MED ORDER — DEXAMETHASONE SODIUM PHOSPHATE 10 MG/ML IJ SOLN
INTRAMUSCULAR | Status: AC
  Filled 2019-09-17: qty 1

## 2019-09-17 MED ORDER — CEFAZOLIN SODIUM-DEXTROSE 2-4 GM/100ML-% IV SOLN
INTRAVENOUS | Status: AC
  Filled 2019-09-17: qty 100

## 2019-09-17 MED ORDER — OXYCODONE HCL 5 MG/5ML PO SOLN: 5.0000 mg | Freq: Once | ORAL | Status: AC | PRN

## 2019-09-17 MED ORDER — ONDANSETRON HCL 4 MG/2ML IJ SOLN
INTRAMUSCULAR | Status: DC | PRN
  Administered 2019-09-17: 4 mg via INTRAVENOUS

## 2019-09-17 MED ORDER — ONDANSETRON HCL 4 MG/2ML IJ SOLN: 4.0000 mg | Freq: Once | INTRAMUSCULAR | Status: DC | PRN

## 2019-09-17 MED ORDER — DEXAMETHASONE SODIUM PHOSPHATE 10 MG/ML IJ SOLN: INTRAMUSCULAR | Status: DC | PRN

## 2019-09-17 SURGICAL SUPPLY — 61 items
BANDAGE ESMARK 6X9 LF (GAUZE/BANDAGES/DRESSINGS) IMPLANT
BENZOIN TINCTURE PRP APPL 2/3 (GAUZE/BANDAGES/DRESSINGS) IMPLANT
BLADE SURG 15 STRL LF DISP TIS (BLADE) ×4 IMPLANT
BLADE SURG 15 STRL SS (BLADE) ×4
BNDG COHESIVE 4X5 TAN STRL (GAUZE/BANDAGES/DRESSINGS) IMPLANT
BNDG ELASTIC 4X5.8 VLCR STR LF (GAUZE/BANDAGES/DRESSINGS) ×8 IMPLANT
BNDG ELASTIC 6X5.8 VLCR STR LF (GAUZE/BANDAGES/DRESSINGS) IMPLANT
BNDG ESMARK 4X9 LF (GAUZE/BANDAGES/DRESSINGS) ×4 IMPLANT
BNDG ESMARK 6X9 LF (GAUZE/BANDAGES/DRESSINGS)
CHLORAPREP W/TINT 26 (MISCELLANEOUS) ×4 IMPLANT
CLOSURE WOUND 1/2 X4 (GAUZE/BANDAGES/DRESSINGS)
COVER BACK TABLE 60X90IN (DRAPES) ×4 IMPLANT
COVER WAND RF STERILE (DRAPES) IMPLANT
CUFF TOURN SGL QUICK 34 (TOURNIQUET CUFF)
CUFF TRNQT CYL 34X4.125X (TOURNIQUET CUFF) IMPLANT
DECANTER SPIKE VIAL GLASS SM (MISCELLANEOUS) IMPLANT
DRAPE C-ARMOR (DRAPES) IMPLANT
DRAPE EXTREMITY T 121X128X90 (DISPOSABLE) ×4 IMPLANT
DRAPE IMP U-DRAPE 54X76 (DRAPES) ×4 IMPLANT
DRAPE OEC MINIVIEW 54X84 (DRAPES) ×4 IMPLANT
DRAPE U-SHAPE 47X51 STRL (DRAPES) ×4 IMPLANT
ELECT REM PT RETURN 9FT ADLT (ELECTROSURGICAL) ×4
ELECTRODE REM PT RTRN 9FT ADLT (ELECTROSURGICAL) ×2 IMPLANT
GAUZE SPONGE 4X4 12PLY STRL (GAUZE/BANDAGES/DRESSINGS) ×4 IMPLANT
GAUZE XEROFORM 1X8 LF (GAUZE/BANDAGES/DRESSINGS) ×4 IMPLANT
GLOVE BIOGEL M STRL SZ7.5 (GLOVE) ×4 IMPLANT
GLOVE BIOGEL PI IND STRL 8 (GLOVE) ×2 IMPLANT
GLOVE BIOGEL PI INDICATOR 8 (GLOVE) ×2
GOWN STRL REUS W/ TWL LRG LVL3 (GOWN DISPOSABLE) ×2 IMPLANT
GOWN STRL REUS W/ TWL XL LVL3 (GOWN DISPOSABLE) ×2 IMPLANT
GOWN STRL REUS W/TWL LRG LVL3 (GOWN DISPOSABLE) ×2
GOWN STRL REUS W/TWL XL LVL3 (GOWN DISPOSABLE) ×2
NDL SAFETY ECLIPSE 18X1.5 (NEEDLE) ×2 IMPLANT
NEEDLE HYPO 18GX1.5 SHARP (NEEDLE) ×2
NS IRRIG 1000ML POUR BTL (IV SOLUTION) ×4 IMPLANT
PAD CAST 4YDX4 CTTN HI CHSV (CAST SUPPLIES) ×2 IMPLANT
PADDING CAST COTTON 4X4 STRL (CAST SUPPLIES) ×2
PADDING CAST SYNTHETIC 4 (CAST SUPPLIES) ×2
PADDING CAST SYNTHETIC 4X4 STR (CAST SUPPLIES) ×2 IMPLANT
PENCIL SMOKE EVACUATOR (MISCELLANEOUS) ×4 IMPLANT
SET BASIN DAY SURGERY F.S. (CUSTOM PROCEDURE TRAY) ×4 IMPLANT
SHEET MEDIUM DRAPE 40X70 STRL (DRAPES) ×4 IMPLANT
SLEEVE SCD COMPRESS KNEE MED (MISCELLANEOUS) ×4 IMPLANT
SPLINT FIBERGLASS 4X30 (CAST SUPPLIES) ×4 IMPLANT
SPONGE LAP 18X18 RF (DISPOSABLE) IMPLANT
STOCKINETTE 6  STRL (DRAPES) ×2
STOCKINETTE 6 STRL (DRAPES) ×2 IMPLANT
STRIP CLOSURE SKIN 1/2X4 (GAUZE/BANDAGES/DRESSINGS) IMPLANT
SUCTION FRAZIER HANDLE 10FR (MISCELLANEOUS) ×2
SUCTION TUBE FRAZIER 10FR DISP (MISCELLANEOUS) ×2 IMPLANT
SUT ETHILON 3 0 PS 1 (SUTURE) ×4 IMPLANT
SUT FIBERWIRE 2-0 18 17.9 3/8 (SUTURE)
SUT MNCRL AB 3-0 PS2 18 (SUTURE) ×4 IMPLANT
SUT PDS AB 2-0 CT2 27 (SUTURE) ×4 IMPLANT
SUT VIC AB 3-0 FS2 27 (SUTURE) IMPLANT
SUTURE FIBERWR 2-0 18 17.9 3/8 (SUTURE) IMPLANT
SYR 10ML LL (SYRINGE) ×4 IMPLANT
SYR BULB EAR ULCER 3OZ GRN STR (SYRINGE) ×4 IMPLANT
TOWEL GREEN STERILE FF (TOWEL DISPOSABLE) ×8 IMPLANT
TUBE CONNECTING 20'X1/4 (TUBING) ×1
TUBE CONNECTING 20X1/4 (TUBING) ×3 IMPLANT

## 2019-09-17 NOTE — Op Note (Signed)
[name] female 19 y.o. 09/17/2019  PreOperative Diagnosis: Left fourth toe PIP dislocation Left fourth toe PIP traumatic arthrotomy Left fourth toe plantar traumatic laceration, 2.5 cm  PostOperative Diagnosis: Same  PROCEDURE: Irrigation and debridement of open PIP arthrotomy fourth toe, left Open reduction internal fixation of fourth toe PIP dislocation Complex repair of plantar toe wound, 2.5 cm  SURGEON: Dub Mikes, MD  ASSISTANT: None  ANESTHESIA: General  FINDINGS: See below  IMPLANTS: 045 K wire  INDICATIONS:18 y.o. female sustained an open PIP joint traumatic arthrotomy and dislocation with plantar laceration approximately 4 days ago.  She underwent attempted closed reduction and closure by the ER but this was unsuccessful.  She was sent due to persistent dislocation and traumatic arthrotomy.  She was indicated for open treatment with irrigation and debridement and wound closure in the OR.  She understood the risks, benefits and alternatives of surgery which include but are not limited to wound healing complications, infection, continued pain, element of arthritis persistent dislocation.  After weighing these risks he opted to proceed.  PROCEDURE: Patient was identified in the preoperative holding area.  Consent was signed by myself and the patient.  Leg was marked by myself.  She was taken the operative suite placed supine the operative table.  General anesthesia was induced no difficulty.  Preoperative antibiotics were given.  Left lower extremity was prepped and draped in usual sterile fashion.  Surgical timeout was performed.  Bone foam and a bump were placed on the left hip.  All bony prominences were well-padded.   Esmarch ankle tourniquet was placed.  We began by inspecting the toe.  There is a 2.5 cm traumatic laceration of the plantar aspect of the fourth toe at the site of the PIP joint.  There is a traumatic arthrotomy in the distal aspect of the  proximal phalanx was exposed.  There was interposed soft tissue and the flexor tendons were wrapped around the proximal phalanx and the joint was persistently dislocated.  Irrigation debridement of traumatic arthrotomy: We began by performing irrigation debridement of the traumatic arthrotomy.  The wound was irrigated copiously with normal saline.  The devitalized tissue was debrided sharply with a 15 blade and excised.  The flexor tendons were intact.  The joint surfaces were inspected and found to be without evidence of cartilage damage.  After adequate irrigation debridement of the surrounding soft tissues and joint we turned our attention to the reduction of the joint.  Open reduction internal fixation of fourth toe PIP joint dislocation: Then the interposed soft tissue was removed from the joint surfaces.  The wound was extended to allow for reduction of the joint.  15 blade was used to extend the wound.  The tendons were wrapped back around the distal aspect of the proximal phalanx and the joint was reduced.  A K wire was placed across the joint in a retrograde fashion from the tip of the toe.  Appropriate joint reduction and K wire placement was confirmed on fluoroscopy.  Complex closure of plantar wound 2.5 cm: Then the wound was again inspected and irrigated.  The excision of the devitalized epidermis and dermal tissue.  The the deep tissue was inspected and a small area of the plantar ligament structures were removed.  There was complete tearing of the plantar ligament structures.  Then in a layered fashion the plantar surface of the toe was closed using 3-0 Monocryl and 3-0 nylon.  This was to incorporate the plantar ligamentous and capsular tissue,  subcutaneous tissue and skin.  The foot was then cleaned.  Soft dressing was placed.  Jurgens balls were placed at the end of the K wire.  Patient tolerated procedure well.  There are no complications.  She was awake from anesthesia and taken  recovery in stable condition.  POST OPERATIVE INSTRUCTIONS: Heel weightbearing to left lower extremity. Continue antibiotics that were provided in the emergency department until the course is complete Call the office with concerns  BLOOD LOSS:  Minimal         DRAINS: none         SPECIMEN: none       COMPLICATIONS:  * No complications entered in OR log *         Disposition: PACU - hemodynamically stable.         Condition: stable

## 2019-09-17 NOTE — H&P (Signed)
Patty Ramos is an 19 y.o. female.   Chief Complaint: Left fourth toe dislocation, open HPI: [name] sustained a left open fourth toe dislocation over the weekend.  A closed reduction was attempted by outside provider.  They were unsuccessful.  She was sent to me for evaluation.  She has pain and deformity of her fourth toe.  She has a laceration plantarly.  Sutures were removed.  Patient has pain in the toe.  She cannot wear regular shoes.  She denies fevers or chills.  Has swelling.  Past Medical History:  Diagnosis Date  . Depression   . Eczema     History reviewed. No pertinent surgical history.  Family History  Problem Relation Age of Onset  . Healthy Mother    Social History:  reports that she has never smoked. She has never used smokeless tobacco. She reports current alcohol use. She reports that she does not use drugs.  Allergies:  Allergies  Allergen Reactions  . Ibuprofen Rash  . Tylenol [Acetaminophen] Rash    Facility-Administered Medications Prior to Admission  Medication Dose Route Frequency Provider Last Rate Last Admin  . azithromycin (ZITHROMAX) tablet 1,000 mg  1,000 mg Oral Once Sparacino, Hailey L, DO      . prenatal multivitamin tablet 1 tablet  1 tablet Oral Q1200 Sparacino, Hailey L, DO       Medications Prior to Admission  Medication Sig Dispense Refill  . traMADol (ULTRAM) 50 MG tablet Take 1 tablet (50 mg total) by mouth every 6 (six) hours as needed. 12 tablet 0    Results for orders placed or performed during the hospital encounter of 09/17/19 (from the past 48 hour(s))  Pregnancy, urine POC     Status: None   Collection Time: 09/17/19 10:19 AM  Result Value Ref Range   Preg Test, Ur NEGATIVE NEGATIVE    Comment:        THE SENSITIVITY OF THIS METHODOLOGY IS >24 mIU/mL    No results found.  Review of Systems  Constitutional: Negative.   HENT: Negative.   Eyes: Negative.   Respiratory: Negative.   Cardiovascular: Negative.    Gastrointestinal: Negative.   Endocrine: Negative.   Musculoskeletal:       Left 4th toe pain  Skin: Negative.   Neurological: Negative.   Hematological: Negative.   Psychiatric/Behavioral: Negative.     Blood pressure (!) 136/95, pulse 78, temperature 98.9 F (37.2 C), temperature source Oral, resp. rate 16, height 5\' 4"  (1.626 m), weight 83.5 kg, last menstrual period 09/16/2019, SpO2 100 %. Physical Exam  HENT:  Head: Normocephalic.  Mouth/Throat: Mucous membranes are moist.  Cardiovascular: Normal rate.  Respiratory: Effort normal.  GI: Soft.  Musculoskeletal:     Cervical back: Neck supple.     Comments: Left 4th toe deformed with plantar laceration.   Neurological: She is alert.  Skin: Skin is warm.  Psychiatric: Mood normal.     Assessment/Plan We will plan for closed versus open reduction of her open fourth toe PIP joint dislocation.  We will also perform arthrotomy and removal of loose bodies within the joint.  We will perform irrigation debridement of the toe wound at the site of the open dislocation.  She will likely require pin fixation.  She understands the above.  She would like to proceed.  The risk, benefits alternatives of surgery which include but not limited to wound healing complications, infection, nonunion, malunion, need for further surgery as well as damage surrounding structures and  recurrent dislocation or development of pain.  She would like to proceed.  Terance Hart, MD 09/17/2019, 11:50 AM

## 2019-09-17 NOTE — Transfer of Care (Signed)
Immediate Anesthesia Transfer of Care Note  Patient: Patty Ramos  Procedure(s) Performed: OPEN REDUCTION INTERNAL FIXATION OF LEFT FOURTH TOE PIP DISLOCATION, IRRIGATION AND DEBRIDEMENT OF PIP JOINT, INCISION AND DRAINAGE FOOT (Left Toe)  Patient Location: PACU  Anesthesia Type:General  Level of Consciousness: drowsy  Airway & Oxygen Therapy: Patient Spontanous Breathing and Patient connected to face mask oxygen  Post-op Assessment: Report given to RN and Post -op Vital signs reviewed and stable  Post vital signs: Reviewed and stable  Last Vitals:  Vitals Value Taken Time  BP 145/104 09/17/19 1309  Temp    Pulse 84 09/17/19 1310  Resp 13 09/17/19 1310  SpO2 100 % 09/17/19 1310  Vitals shown include unvalidated device data.  Last Pain:  Vitals:   09/17/19 1032  TempSrc: Oral  PainSc: 7       Patients Stated Pain Goal: 5 (09/17/19 1032)  Complications: No complications documented.

## 2019-09-17 NOTE — Anesthesia Preprocedure Evaluation (Signed)
Anesthesia Evaluation  Patient identified by MRN, date of birth, ID band Patient awake    Reviewed: Allergy & Precautions, NPO status , Patient's Chart, lab work & pertinent test results  History of Anesthesia Complications Negative for: history of anesthetic complications  Airway Mallampati: II  TM Distance: >3 FB Neck ROM: Full    Dental  (+) Teeth Intact   Pulmonary neg pulmonary ROS,    Pulmonary exam normal        Cardiovascular negative cardio ROS Normal cardiovascular exam     Neuro/Psych negative neurological ROS  negative psych ROS   GI/Hepatic negative GI ROS, Neg liver ROS,   Endo/Other  negative endocrine ROS  Renal/GU negative Renal ROS  negative genitourinary   Musculoskeletal negative musculoskeletal ROS (+)   Abdominal   Peds  Hematology negative hematology ROS (+)   Anesthesia Other Findings   Reproductive/Obstetrics                             Anesthesia Physical Anesthesia Plan  ASA: II  Anesthesia Plan: General   Post-op Pain Management:    Induction: Intravenous  PONV Risk Score and Plan: 3 and Ondansetron, Dexamethasone, Midazolam and Treatment may vary due to age or medical condition  Airway Management Planned: LMA  Additional Equipment: None  Intra-op Plan:   Post-operative Plan: Extubation in OR  Informed Consent: I have reviewed the patients History and Physical, chart, labs and discussed the procedure including the risks, benefits and alternatives for the proposed anesthesia with the patient or authorized representative who has indicated his/her understanding and acceptance.     Dental advisory given  Plan Discussed with:   Anesthesia Plan Comments:         Anesthesia Quick Evaluation

## 2019-09-17 NOTE — Discharge Instructions (Signed)
DR. Lucia Gaskins FOOT & ANKLE SURGERY POST-OP INSTRUCTIONS   Pain Management 1. The numbing medicine and your leg will last around 18 hours, take a dose of your pain medicine as soon as you feel it wearing off to avoid rebound pain. 2. Keep your foot elevated above heart level.  Make sure that your heel hangs free ('floats'). 3. Take all prescribed medication as directed. 4. If taking narcotic pain medication you may want to use an over-the-counter stool softener to avoid constipation. 5. You may take over-the-counter NSAIDs (ibuprofen, naproxen, etc.) as well as over-the-counter acetaminophen as directed on the packaging as a supplement for your pain and may also use it to wean away from the prescription medication.  Activity ? Heel WB in post operative shoe. ? Keep dressing in place  First Postoperative Visit 1. Your first postop visit will be at least 2 weeks after surgery.  This should be scheduled when you schedule surgery. 2. If you do not have a postoperative visit scheduled please call [phone] to schedule an appointment. 3. At the appointment your incision will be evaluated for suture removal, x-rays will be obtained if necessary.  General Instructions 1. Swelling is very common after foot and ankle surgery.  It often takes 3 months for the foot and ankle to begin to feel comfortable.  Some amount of swelling will persist for 6-12 months. 2. DO NOT change the dressing.  If there is a problem with the dressing (too tight, loose, gets wet, etc.) please contact Dr. Pollie Friar office. 3. DO NOT get the dressing wet.  For showers you can use an over-the-counter cast cover or wrap a washcloth around the top of your dressing and then cover it with a plastic bag and tape it to your leg. 4. DO NOT soak the incision (no tubs, pools, bath, etc.) until you have approval from Dr. Lucia Gaskins.  Contact Dr. Huel Cote office or go to Emergency Room if: 1. Temperature above 101 F. 2. Increasing pain that is  unresponsive to pain medication or elevation 3. Excessive redness or swelling in your foot 4. Dressing problems - excessive bloody drainage, looseness or tightness, or if dressing gets wet 5. Develop pain, swelling, warmth, or discoloration of your calf   Post Anesthesia Home Care Instructions  Activity: Get plenty of rest for the remainder of the day. A responsible individual must stay with you for 24 hours following the procedure.  For the next 24 hours, DO NOT: -Drive a car -Paediatric nurse -Drink alcoholic beverages -Take any medication unless instructed by your physician -Make any legal decisions or sign important papers.  Meals: Start with liquid foods such as gelatin or soup. Progress to regular foods as tolerated. Avoid greasy, spicy, heavy foods. If nausea and/or vomiting occur, drink only clear liquids until the nausea and/or vomiting subsides. Call your physician if vomiting continues.  Special Instructions/Symptoms: Your throat may feel dry or sore from the anesthesia or the breathing tube placed in your throat during surgery. If this causes discomfort, gargle with warm salt water. The discomfort should disappear within 24 hours.  If you had a scopolamine patch placed behind your ear for the management of post- operative nausea and/or vomiting:  1. The medication in the patch is effective for 72 hours, after which it should be removed.  Wrap patch in a tissue and discard in the trash. Wash hands thoroughly with soap and water. 2. You may remove the patch earlier than 72 hours if you experience unpleasant side effects  which may include dry mouth, dizziness or visual disturbances. 3. Avoid touching the patch. Wash your hands with soap and water after contact with the patch.

## 2019-09-17 NOTE — Anesthesia Procedure Notes (Signed)
Procedure Name: LMA Insertion Date/Time: 09/17/2019 12:26 PM Performed by: Montez Morita, Bernadene Garside W, CRNA Pre-anesthesia Checklist: Patient identified, Emergency Drugs available, Suction available and Patient being monitored Patient Re-evaluated:Patient Re-evaluated prior to induction Oxygen Delivery Method: Circle system utilized Preoxygenation: Pre-oxygenation with 100% oxygen Induction Type: IV induction Ventilation: Mask ventilation without difficulty LMA: LMA inserted LMA Size: 4.0 Number of attempts: 1 Airway Equipment and Method: Bite block Placement Confirmation: positive ETCO2 Tube secured with: Tape Dental Injury: Teeth and Oropharynx as per pre-operative assessment

## 2019-09-17 NOTE — Anesthesia Postprocedure Evaluation (Signed)
Anesthesia Post Note  Patient: Patty Ramos  Procedure(s) Performed: OPEN REDUCTION INTERNAL FIXATION OF LEFT FOURTH TOE PROXIMAL INTERPHALANGEAL DISLOCATION (Left Toe) IRRIGATION AND DEBRIDEMENT OF LEFT FOOT FOURTH PROXIMAL PHALANGEAL JOINT (Left Foot)     Patient location during evaluation: PACU Anesthesia Type: General Level of consciousness: awake and alert Pain management: pain level controlled Vital Signs Assessment: post-procedure vital signs reviewed and stable Respiratory status: spontaneous breathing, nonlabored ventilation and respiratory function stable Cardiovascular status: blood pressure returned to baseline and stable Postop Assessment: no apparent nausea or vomiting Anesthetic complications: no   No complications documented.  Last Vitals:  Vitals:   09/17/19 1400 09/17/19 1427  BP:  128/90  Pulse: (!) 59 64  Resp: 10 16  Temp:  36.9 C  SpO2: 100% 100%    Last Pain:  Vitals:   09/17/19 1427  TempSrc:   PainSc: 9                  Channing Yeager E Darl Kuss

## 2019-09-18 ENCOUNTER — Encounter (HOSPITAL_BASED_OUTPATIENT_CLINIC_OR_DEPARTMENT_OTHER): Payer: Self-pay | Admitting: Orthopaedic Surgery

## 2019-12-11 ENCOUNTER — Other Ambulatory Visit: Payer: Self-pay

## 2019-12-11 ENCOUNTER — Emergency Department (HOSPITAL_COMMUNITY)
Admission: EM | Admit: 2019-12-11 | Discharge: 2019-12-11 | Disposition: A | Discharge status: Home / Self Care | Payer: Medicaid Other | Attending: Emergency Medicine | Admitting: Emergency Medicine

## 2019-12-11 ENCOUNTER — Encounter (HOSPITAL_COMMUNITY): Payer: Self-pay

## 2019-12-11 DIAGNOSIS — R1031 Right lower quadrant pain: Secondary | ICD-10-CM | POA: Insufficient documentation

## 2019-12-11 DIAGNOSIS — Z5321 Procedure and treatment not carried out due to patient leaving prior to being seen by health care provider: Secondary | ICD-10-CM | POA: Diagnosis not present

## 2019-12-11 LAB — CBC
HCT: 40 % (ref 36.0–46.0)
Hemoglobin: 12.4 g/dL (ref 12.0–15.0)
MCH: 24.3 pg — ABNORMAL LOW (ref 26.0–34.0)
MCHC: 31 g/dL (ref 30.0–36.0)
MCV: 78.4 fL — ABNORMAL LOW (ref 80.0–100.0)
Platelets: 291 10*3/uL (ref 150–400)
RBC: 5.1 MIL/uL (ref 3.87–5.11)
RDW: 14.6 % (ref 11.5–15.5)
WBC: 18.2 10*3/uL — ABNORMAL HIGH (ref 4.0–10.5)
nRBC: 0 % (ref 0.0–0.2)

## 2019-12-11 LAB — URINALYSIS, ROUTINE W REFLEX MICROSCOPIC
Bacteria, UA: NONE SEEN
Bilirubin Urine: NEGATIVE
Glucose, UA: NEGATIVE mg/dL
Hgb urine dipstick: NEGATIVE
Ketones, ur: 20 mg/dL — AB
Nitrite: NEGATIVE
Protein, ur: 100 mg/dL — AB
Specific Gravity, Urine: 1.027 (ref 1.005–1.030)
pH: 5 (ref 5.0–8.0)

## 2019-12-11 LAB — COMPREHENSIVE METABOLIC PANEL
ALT: 13 U/L (ref 0–44)
AST: 18 U/L (ref 15–41)
Albumin: 3.8 g/dL (ref 3.5–5.0)
Alkaline Phosphatase: 62 U/L (ref 38–126)
Anion gap: 14 (ref 5–15)
BUN: 11 mg/dL (ref 6–20)
CO2: 21 mmol/L — ABNORMAL LOW (ref 22–32)
Calcium: 8.9 mg/dL (ref 8.9–10.3)
Chloride: 99 mmol/L (ref 98–111)
Creatinine, Ser: 1.16 mg/dL — ABNORMAL HIGH (ref 0.44–1.00)
GFR calc Af Amer: >60 mL/min (ref >60)
GFR calc non Af Amer: >60 mL/min (ref >60)
Glucose, Bld: 97 mg/dL (ref 70–99)
Potassium: 3.4 mmol/L — ABNORMAL LOW (ref 3.5–5.1)
Sodium: 134 mmol/L — ABNORMAL LOW (ref 135–145)
Total Bilirubin: 1.4 mg/dL — ABNORMAL HIGH (ref 0.3–1.2)
Total Protein: 8.1 g/dL (ref 6.5–8.1)

## 2019-12-11 LAB — I-STAT BETA HCG BLOOD, ED (MC, WL, AP ONLY): I-stat hCG, quantitative: <5 m[IU]/mL (ref <5)

## 2019-12-11 LAB — LIPASE, BLOOD: Lipase: 21 U/L (ref 11–51)

## 2019-12-11 NOTE — ED Notes (Signed)
Called for Pt to recheck vitals no answer again.

## 2019-12-11 NOTE — ED Notes (Signed)
Called to recheck Vitals. No answer x3. 

## 2019-12-11 NOTE — ED Triage Notes (Signed)
Pt reports that she has been having RLQ abd pain since last night, denies n/v/d or urinary symptoms or discharge.

## 2019-12-15 ENCOUNTER — Other Ambulatory Visit (HOSPITAL_COMMUNITY)
Admission: RE | Admit: 2019-12-15 | Discharge: 2019-12-15 | Disposition: A | Discharge status: Home / Self Care | Payer: Medicaid Other | Source: Ambulatory Visit | Attending: Obstetrics & Gynecology | Admitting: Obstetrics & Gynecology

## 2019-12-15 ENCOUNTER — Ambulatory Visit (INDEPENDENT_AMBULATORY_CARE_PROVIDER_SITE_OTHER): Payer: Medicaid Other | Admitting: Obstetrics & Gynecology

## 2019-12-15 ENCOUNTER — Encounter: Payer: Self-pay | Admitting: Obstetrics & Gynecology

## 2019-12-15 VITALS — BP 127/81 | HR 86 | Ht 64.0 in | Wt 180.0 lb

## 2019-12-15 DIAGNOSIS — N730 Acute parametritis and pelvic cellulitis: Secondary | ICD-10-CM

## 2019-12-15 DIAGNOSIS — Z8619 Personal history of other infectious and parasitic diseases: Secondary | ICD-10-CM

## 2019-12-15 DIAGNOSIS — B373 Candidiasis of vulva and vagina: Secondary | ICD-10-CM

## 2019-12-15 DIAGNOSIS — R102 Pelvic and perineal pain: Secondary | ICD-10-CM | POA: Insufficient documentation

## 2019-12-15 DIAGNOSIS — A749 Chlamydial infection, unspecified: Secondary | ICD-10-CM

## 2019-12-15 DIAGNOSIS — B3731 Acute candidiasis of vulva and vagina: Secondary | ICD-10-CM

## 2019-12-15 DIAGNOSIS — B9689 Other specified bacterial agents as the cause of diseases classified elsewhere: Secondary | ICD-10-CM

## 2019-12-15 DIAGNOSIS — A549 Gonococcal infection, unspecified: Secondary | ICD-10-CM | POA: Diagnosis not present

## 2019-12-15 DIAGNOSIS — N760 Acute vaginitis: Secondary | ICD-10-CM

## 2019-12-15 NOTE — Patient Instructions (Signed)
Pelvic Pain, Female Pelvic pain is pain in your lower abdomen, below your belly button and between your hips. The pain may start suddenly (be acute), keep coming back (be recurring), or last a long time (become chronic). Pelvic pain that lasts longer than 6 months is considered chronic. Pelvic pain may affect your:  Reproductive organs.  Urinary system.  Digestive tract.  Musculoskeletal system. There are many potential causes of pelvic pain. Sometimes, the pain can be a result of digestive or urinary conditions, strained muscles or ligaments, or reproductive conditions. Sometimes the cause of pelvic pain is not known. Follow these instructions at home:   Take over-the-counter and prescription medicines only as told by your health care provider.  Rest as told by your health care provider.  Do not have sex if it hurts.  Keep a journal of your pelvic pain. Write down: ? When the pain started. ? Where the pain is located. ? What seems to make the pain better or worse, such as food or your period (menstrual cycle). ? Any symptoms you have along with the pain.  Keep all follow-up visits as told by your health care provider. This is important. Contact a health care provider if:  Medicine does not help your pain.  Your pain comes back.  You have new symptoms.  You have abnormal vaginal discharge or bleeding, including bleeding after menopause.  You have a fever or chills.  You are constipated.  You have blood in your urine or stool.  You have foul-smelling urine.  You feel weak or light-headed. Get help right away if:  You have sudden severe pain.  Your pain gets steadily worse.  You have severe pain along with fever, nausea, vomiting, or excessive sweating.  You lose consciousness. Summary  Pelvic pain is pain in your lower abdomen, below your belly button and between your hips.  There are many potential causes of pelvic pain.  Keep a journal of your pelvic  pain. This information is not intended to replace advice given to you by your health care provider. Make sure you discuss any questions you have with your health care provider. Document Revised: 08/28/2017 Document Reviewed: 08/28/2017 Elsevier Patient Education  2020 [employer]  

## 2019-12-15 NOTE — Progress Notes (Signed)
GYNECOLOGY OFFICE VISIT NOTE  History:   Patty Ramos is a 19 y.o. G0P0000 here today for evaluation of R lower pelvic pain.  Rates it 8-10/10, constant, helped by showers. Not taking any medications. Radiates upwards to  Right upper abdomen and mid abdomen.  Also has pelvic fullness. Also reports white vaginal discharge. No association with food, no GI or GU symptoms. No fevers, abnormal vaginal bleeding or other concerns.    Past Medical History:  Diagnosis Date  . Chlamydia infection 01/07/2019  . Depression   . Eczema   . Trichomonas vaginalis infection 07/24/2018    Past Surgical History:  Procedure Laterality Date  . I & D EXTREMITY Left 09/17/2019   Procedure: IRRIGATION AND DEBRIDEMENT OF LEFT FOOT FOURTH PROXIMAL PHALANGEAL JOINT;  Surgeon: Terance Hart, MD;  Location: [facility];  Service: Orthopedics;  Laterality: Left;  . ORIF TOE FRACTURE Left 09/17/2019   Procedure: OPEN REDUCTION INTERNAL FIXATION OF LEFT FOURTH TOE PROXIMAL INTERPHALANGEAL DISLOCATION;  Surgeon: Terance Hart, MD;  Location: [facility];  Service: Orthopedics;  Laterality: Left;  LENGTH OF SURGERY: 1 HOUR    The following portions of the patient's history were reviewed and updated as appropriate: allergies, current medications, past family history, past medical history, past social history, past surgical history and problem list.   Review of Systems:  Pertinent items noted in HPI and remainder of comprehensive ROS otherwise negative.  Physical Exam:  BP 127/81   Pulse 86   Ht 5\' 4"  (1.626 m)   Wt 180 lb (81.6 kg)   LMP 11/27/2019 (Approximate)   BMI 30.90 kg/m  CONSTITUTIONAL: Well-developed, well-nourished female in no acute distress.  SKIN: No rash noted. Not diaphoretic. No erythema. No pallor. MUSCULOSKELETAL: Normal range of motion. No edema noted. NEUROLOGIC: Alert and oriented to person, place, and time. Normal muscle tone coordination. No  cranial nerve deficit noted. PSYCHIATRIC: Normal mood and affect. Normal behavior. Normal judgment and thought content. CARDIOVASCULAR: Normal heart rate noted RESPIRATORY: Effort and breath sounds normal, no problems with respiration noted ABDOMEN: Mild TTP in entire right side of abdomen from RUQ to RLQ. No rebound or guarding. No masses noted. No other overt distention noted.   PELVIC: Normal appearing external genitalia; normal urethral meatus; normal appearing vaginal distal mucosa and cervix.  White discharge noted, sample obtained for testing. On bimanual exam, very tender to touch in right adnexal area.  Normal uterine size, no other palpable masses, no uterine or left adnexal tenderness. Performed in the presence of a chaperone  Labs and Imaging Results for orders placed or performed during the hospital encounter of 12/11/19 (from the past 168 hour(s))  Lipase, blood   Collection Time: 12/11/19  1:28 AM  Result Value Ref Range   Lipase 21 11 - 51 U/L  Comprehensive metabolic panel   Collection Time: 12/11/19  1:28 AM  Result Value Ref Range   Sodium 134 (L) 135 - 145 mmol/L   Potassium 3.4 (L) 3.5 - 5.1 mmol/L   Chloride 99 98 - 111 mmol/L   CO2 21 (L) 22 - 32 mmol/L   Glucose, Bld 97 70 - 99 mg/dL   BUN 11 6 - 20 mg/dL   Creatinine, Ser 12/13/19 (H) 0.44 - 1.00 mg/dL   Calcium 8.9 8.9 - 1.61 mg/dL   Total Protein 8.1 6.5 - 8.1 g/dL   Albumin 3.8 3.5 - 5.0 g/dL   AST 18 15 - 41 U/L   ALT 13  0 - 44 U/L   Alkaline Phosphatase 62 38 - 126 U/L   Total Bilirubin 1.4 (H) 0.3 - 1.2 mg/dL   GFR calc non Af Amer >60 >60 mL/min   GFR calc Af Amer >60 >60 mL/min   Anion gap 14 5 - 15  CBC   Collection Time: 12/11/19  1:28 AM  Result Value Ref Range   WBC 18.2 (H) 4.0 - 10.5 K/uL   RBC 5.10 3.87 - 5.11 MIL/uL   Hemoglobin 12.4 12.0 - 15.0 g/dL   HCT 54.6 36 - 46 %   MCV 78.4 (L) 80.0 - 100.0 fL   MCH 24.3 (L) 26.0 - 34.0 pg   MCHC 31.0 30.0 - 36.0 g/dL   RDW 50.3 54.6 - 56.8 %    Platelets 291 150 - 400 K/uL   nRBC 0.0 0.0 - 0.2 %  Urinalysis, Routine w reflex microscopic Urine, Clean Catch   Collection Time: 12/11/19  1:28 AM  Result Value Ref Range   Color, Urine AMBER (A) YELLOW   APPearance HAZY (A) CLEAR   Specific Gravity, Urine 1.027 1.005 - 1.030   pH 5.0 5.0 - 8.0   Glucose, UA NEGATIVE NEGATIVE mg/dL   Hgb urine dipstick NEGATIVE NEGATIVE   Bilirubin Urine NEGATIVE NEGATIVE   Ketones, ur 20 (A) NEGATIVE mg/dL   Protein, ur 127 (A) NEGATIVE mg/dL   Nitrite NEGATIVE NEGATIVE   Leukocytes,Ua SMALL (A) NEGATIVE   RBC / HPF 0-5 0 - 5 RBC/hpf   WBC, UA 21-50 0 - 5 WBC/hpf   Bacteria, UA NONE SEEN NONE SEEN   Squamous Epithelial / LPF 11-20 0 - 5   Mucus PRESENT    Non Squamous Epithelial 0-5 (A) NONE SEEN  I-Stat beta hCG blood, ED   Collection Time: 12/11/19  2:09 AM  Result Value Ref Range   I-stat hCG, quantitative <5.0 <5 mIU/mL   Comment 3           No results found.    Assessment and Plan:      1. Pelvic pain in female 2. History of chlamydia and trichomonas vaginalis in 2020 Unsure etiology of pain but very tender in R adnexal area. Will obtain scan to evaluate and also check for infections.  Will follow up results and manage accordingly. Advised to use Tylenol prn pain. - Cervicovaginal ancillary only( [place]) - US PELVIC COMPLETE WITH TRANSVAGINAL; Future Routine preventative health maintenance measures emphasized, return for pap smear and breast exam soon. Please refer to After Visit Summary for other counseling recommendations.   Return in about 2 weeks (around 12/29/2019) for Annual exam .    Total face-to-face time with patient: 15 minutes.  Over 50% of encounter was spent on counseling and coordination of care.   Jaynie Collins, MD, FACOG Obstetrician & Gynecologist, [facility] [employer], [facility]

## 2019-12-16 LAB — CERVICOVAGINAL ANCILLARY ONLY
Bacterial Vaginitis (gardnerella): POSITIVE — AB
Candida Glabrata: NEGATIVE
Candida Vaginitis: POSITIVE — AB
Chlamydia: POSITIVE — AB
Comment: NEGATIVE
Comment: NEGATIVE
Comment: NEGATIVE
Comment: NEGATIVE
Comment: NEGATIVE
Comment: NORMAL
Neisseria Gonorrhea: POSITIVE — AB
Trichomonas: NEGATIVE

## 2019-12-17 ENCOUNTER — Encounter: Payer: Self-pay | Admitting: Obstetrics & Gynecology

## 2019-12-17 DIAGNOSIS — A549 Gonococcal infection, unspecified: Secondary | ICD-10-CM

## 2019-12-17 DIAGNOSIS — N760 Acute vaginitis: Secondary | ICD-10-CM | POA: Insufficient documentation

## 2019-12-17 DIAGNOSIS — B3731 Acute candidiasis of vulva and vagina: Secondary | ICD-10-CM | POA: Insufficient documentation

## 2019-12-17 DIAGNOSIS — N730 Acute parametritis and pelvic cellulitis: Secondary | ICD-10-CM

## 2019-12-17 DIAGNOSIS — A749 Chlamydial infection, unspecified: Secondary | ICD-10-CM

## 2019-12-17 HISTORY — DX: Acute parametritis and pelvic cellulitis: N73.0

## 2019-12-17 HISTORY — DX: Chlamydial infection, unspecified: A74.9

## 2019-12-17 HISTORY — DX: Gonococcal infection, unspecified: A54.9

## 2019-12-17 MED ORDER — METRONIDAZOLE 500 MG PO TABS: 500.0000 mg | ORAL_TABLET | Freq: Two times a day (BID) | ORAL | 0 refills | Status: AC

## 2019-12-17 MED ORDER — CEFTRIAXONE SODIUM 500 MG IJ SOLR: 500.0000 mg | Freq: Once | INTRAMUSCULAR | Status: AC

## 2019-12-17 MED ORDER — DOXYCYCLINE HYCLATE 100 MG PO CAPS: 100.0000 mg | ORAL_CAPSULE | Freq: Two times a day (BID) | ORAL | 0 refills | Status: AC

## 2019-12-17 MED ORDER — FLUCONAZOLE 150 MG PO TABS: 150.0000 mg | ORAL_TABLET | ORAL | 3 refills | Status: AC

## 2019-12-17 NOTE — Progress Notes (Signed)
Vaginal discharge test is abnormal and showed gonorrhea, chlamydia, bacterial and yeast vaginitis. The gonorrhea and chlamydia can cause pelvic inflammatory disease (PID) and the pain can be felt in lower abdomen and also in upper right abdomen as inflammation can be around the liver. Recommend testing for other STIs, also needs to let partner(s) know so the partner(s) can get testing and treatment. Patient and sex partner(s) should abstain from unprotected sexual activity for at least two weeks after everyone receives appropriate treatment.  Patient needs to come in to get treated for PID: needs Ceftriaxone 500 mg  IM.  Will also prescribe Doxycyline 100 gm po bid & Metronidazole 500 mg po bid for 14 days.  Emphasize need to complete entire course.  The bacterial vaginitis will be treated with this regimen, will prescribe Diflucan 150 mg po q3  days x 3 doses to help with a yeast infection and help prevent rebound yeast infection from all these antibiotics.  Patient will need to return in about 4 weeks after treatment for repeat test of cure, please make this a provider visit.  Please call to inform patient of results and recommendations, and advise to pick up prescription. Please advise patient to practice safe sex at all times, and discuss long tem implications of STIs, PID.  Brenly Trawick, MD

## 2019-12-17 NOTE — Addendum Note (Signed)
Addended by: Jaynie Collins A on: 12/17/2019 12:34 PM   Modules accepted: Orders

## 2019-12-17 NOTE — Progress Notes (Signed)
Vaginal discharge test is abnormal and showed gonorrhea, chlamydia, bacterial and yeast vaginitis. The gonorrhea and chlamydia can cause pelvic inflammatory disease (PID) and the pain can be felt in lower abdomen and also in upper right abdomen as inflammation can be around the liver. Recommend testing for other STIs, also needs to let partner(s) know so the partner(s) can get testing and treatment. Patient and sex partner(s) should abstain from unprotected sexual activity for at least two weeks after everyone receives appropriate treatment.  Patient needs to come in to get treated for PID: needs Ceftriaxone 500 mg  IM.  Will also prescribe Doxycyline 100 gm po bid & Metronidazole 500 mg po bid for 14 days.  Emphasize need to complete entire course.  The bacterial vaginitis will be treated with this regimen, will prescribe Diflucan 150 mg po q3  days x 3 doses to help with a yeast infection and help prevent rebound yeast infection from all these antibiotics.  Patient will need to return in about 4 weeks after treatment for repeat test of cure, please make this a provider visit.  Please call to inform patient of results and recommendations, and advise to pick up prescription. Please advise patient to practice safe sex at all times, and discuss long tem implications of STIs, PID.  Jaynie Collins, MD

## 2019-12-21 ENCOUNTER — Other Ambulatory Visit: Payer: Self-pay

## 2019-12-21 ENCOUNTER — Ambulatory Visit (INDEPENDENT_AMBULATORY_CARE_PROVIDER_SITE_OTHER): Payer: Medicaid Other

## 2019-12-21 VITALS — Wt 179.0 lb

## 2019-12-21 DIAGNOSIS — A5402 Gonococcal vulvovaginitis, unspecified: Secondary | ICD-10-CM

## 2019-12-21 DIAGNOSIS — A549 Gonococcal infection, unspecified: Secondary | ICD-10-CM

## 2019-12-21 MED ORDER — CEFTRIAXONE SODIUM 500 MG IJ SOLR
500.0000 mg | Freq: Once | INTRAMUSCULAR | Status: AC
  Administered 2019-12-21: 500 mg via INTRAMUSCULAR

## 2019-12-21 NOTE — Progress Notes (Addendum)
SUBJECTIVE 19 y.o GYN presents for Rocephin Injection for +Gonorrhea. She is taking her medications as prescribed. Partner has not been treated yet, she is not having sex with him.  PLAN Patient will need to return in about 4 weeks after treatment for repeat test of cure, please make this a provider visit per Dr. Mervyn Skeeters  She also recommends testing for other STIs, also needs to let partner(s) know so the partner(s) can get testing and treatment. Patient and sex partner(s) should abstain from unprotected sexual activity for at least two weeks after everyone receives appropriate treatment.   Administrations This Visit    cefTRIAXone (ROCEPHIN) injection 500 mg    Admin Date 12/21/2019 Action Given Dose 500 mg Route Intramuscular Administered By Maretta Bees, RMA

## 2019-12-21 NOTE — Progress Notes (Signed)
Patient was assessed and managed by nursing staff during this encounter. I have reviewed the chart and agree with the documentation and plan. I have also made any necessary editorial changes.  Catalina Antigua, MD 12/21/2019 4:15 PM

## 2019-12-23 ENCOUNTER — Ambulatory Visit: Admission: RE | Admit: 2019-12-23 | Payer: Medicaid Other | Source: Ambulatory Visit

## 2020-01-11 ENCOUNTER — Telehealth: Payer: Self-pay

## 2020-01-11 ENCOUNTER — Ambulatory Visit: Payer: Medicaid Other | Admitting: Advanced Practice Midwife

## 2020-01-11 NOTE — Progress Notes (Deleted)
Subjective:     Patty Ramos is a 19 y.o. female here at [facility] *** for a routine exam.  Current complaints: ***.  Personal health questionnaire reviewed: {yes/no:9010}.  Do you have a primary care provider? *** Do you feel safe at home? ***    Risk factors for chronic health problems: Smoking: Alchohol/how much: Pt BMI: There is no height or weight on file to calculate BMI.   Gynecologic History No LMP recorded. (Menstrual status: Irregular Periods). Contraception: {method:5051} Last Pap: ***. Results were: {norm/abn:16337} Last mammogram: ***. Results were: {norm/abn:16337}  Obstetric History OB History  Gravida Para Term Preterm AB Living  0 0 0 0 0 0  SAB TAB Ectopic Multiple Live Births  0 0 0 0 0     {Common ambulatory SmartLinks:19316}  Review of Systems {ros; complete:30496}    Objective:   There were no vitals taken for this visit. VS reviewed, nursing note reviewed,  Constitutional: well developed, well nourished, no distress HEENT: normocephalic CV: normal rate Pulm/chest wall: normal effort Breast Exam:  ***Deferred with low risks and shared decision making, discussed recommendation to start mammogram between 40-50 yo/ exam performed: right breast normal without mass, skin or nipple changes or axillary nodes, left breast normal without mass, skin or nipple changes or axillary nodes Abdomen: soft Neuro: alert and oriented x 3 Skin: warm, dry Psych: affect normal Pelvic exam: ***Deferred/ Performed: Cervix pink, visually closed, without lesion, scant white creamy discharge, vaginal walls and external genitalia normal Bimanual exam: Cervix 0/long/high, firm, anterior, neg CMT, uterus nontender, nonenlarged, adnexa without tenderness, enlargement, or mass       Assessment/Plan:   There are no diagnoses linked to this encounter.      Follow up in: {1-10:13787:::0} {time; units:19136:::0} or as needed.   Sharen Counter, CNM 8:14 AM

## 2020-01-11 NOTE — Telephone Encounter (Signed)
Return call to pt regarding triage vm  No answer left vm for pt to return call.

## 2020-01-18 ENCOUNTER — Ambulatory Visit: Payer: Medicaid Other

## 2020-02-09 ENCOUNTER — Ambulatory Visit: Payer: Medicaid Other | Admitting: Advanced Practice Midwife

## 2020-02-23 ENCOUNTER — Emergency Department (HOSPITAL_COMMUNITY)
Admission: EM | Admit: 2020-02-23 | Discharge: 2020-02-23 | Disposition: A | Discharge status: Home / Self Care | Payer: Medicaid Other | Attending: Emergency Medicine | Admitting: Emergency Medicine

## 2020-02-23 ENCOUNTER — Encounter (HOSPITAL_COMMUNITY): Payer: Self-pay

## 2020-02-23 ENCOUNTER — Other Ambulatory Visit: Payer: Self-pay

## 2020-02-23 DIAGNOSIS — N939 Abnormal uterine and vaginal bleeding, unspecified: Secondary | ICD-10-CM | POA: Diagnosis not present

## 2020-02-23 DIAGNOSIS — Z5321 Procedure and treatment not carried out due to patient leaving prior to being seen by health care provider: Secondary | ICD-10-CM | POA: Diagnosis not present

## 2020-02-23 DIAGNOSIS — Z8742 Personal history of other diseases of the female genital tract: Secondary | ICD-10-CM | POA: Diagnosis not present

## 2020-02-23 DIAGNOSIS — R1031 Right lower quadrant pain: Secondary | ICD-10-CM | POA: Diagnosis not present

## 2020-02-23 LAB — COMPREHENSIVE METABOLIC PANEL
ALT: 17 U/L (ref 0–44)
AST: 18 U/L (ref 15–41)
Albumin: 4.4 g/dL (ref 3.5–5.0)
Alkaline Phosphatase: 52 U/L (ref 38–126)
Anion gap: 7 (ref 5–15)
BUN: 15 mg/dL (ref 6–20)
CO2: 24 mmol/L (ref 22–32)
Calcium: 9.2 mg/dL (ref 8.9–10.3)
Chloride: 105 mmol/L (ref 98–111)
Creatinine, Ser: 1.02 mg/dL — ABNORMAL HIGH (ref 0.44–1.00)
GFR, Estimated: >60 mL/min (ref >60)
Glucose, Bld: 85 mg/dL (ref 70–99)
Potassium: 3.9 mmol/L (ref 3.5–5.1)
Sodium: 136 mmol/L (ref 135–145)
Total Bilirubin: 0.8 mg/dL (ref 0.3–1.2)
Total Protein: 7.7 g/dL (ref 6.5–8.1)

## 2020-02-23 LAB — I-STAT BETA HCG BLOOD, ED (MC, WL, AP ONLY): I-stat hCG, quantitative: <5 m[IU]/mL (ref <5)

## 2020-02-23 LAB — CBC
HCT: 41.9 % (ref 36.0–46.0)
Hemoglobin: 13.4 g/dL (ref 12.0–15.0)
MCH: 25.4 pg — ABNORMAL LOW (ref 26.0–34.0)
MCHC: 32 g/dL (ref 30.0–36.0)
MCV: 79.4 fL — ABNORMAL LOW (ref 80.0–100.0)
Platelets: 254 10*3/uL (ref 150–400)
RBC: 5.28 MIL/uL — ABNORMAL HIGH (ref 3.87–5.11)
RDW: 14.7 % (ref 11.5–15.5)
WBC: 6.7 10*3/uL (ref 4.0–10.5)
nRBC: 0 % (ref 0.0–0.2)

## 2020-02-23 LAB — LIPASE, BLOOD: Lipase: 24 U/L (ref 11–51)

## 2020-02-23 NOTE — ED Triage Notes (Signed)
Pt reports sharp pain in RLQ. Pt states she has a history of ovarian cysts. Pt also states wanting STD screening. Pt reports intermittent vaginal bleeding. Denies abnormal discharge.

## 2020-03-01 ENCOUNTER — Ambulatory Visit (HOSPITAL_COMMUNITY)
Admission: EM | Admit: 2020-03-01 | Discharge: 2020-03-01 | Disposition: A | Discharge status: Home / Self Care | Payer: Medicaid Other

## 2020-03-01 NOTE — ED Notes (Signed)
Attempted to call patient with number on file.  Call answered and then hung up.  Attempted 2nd call with no answer. Patient not in lobby or outside.

## 2020-03-01 NOTE — ED Notes (Signed)
No answer in waiting area or outside [facility] when called for tx room assignment

## 2020-03-03 ENCOUNTER — Ambulatory Visit: Payer: Medicaid Other | Admitting: Nurse Practitioner

## 2020-03-08 ENCOUNTER — Encounter (HOSPITAL_COMMUNITY): Payer: Self-pay | Admitting: Emergency Medicine

## 2020-03-08 ENCOUNTER — Emergency Department (HOSPITAL_COMMUNITY)
Admission: EM | Admit: 2020-03-08 | Discharge: 2020-03-08 | Disposition: A | Discharge status: Home / Self Care | Payer: Medicaid Other | Attending: Emergency Medicine | Admitting: Emergency Medicine

## 2020-03-08 DIAGNOSIS — Z5321 Procedure and treatment not carried out due to patient leaving prior to being seen by health care provider: Secondary | ICD-10-CM | POA: Diagnosis not present

## 2020-03-08 DIAGNOSIS — R519 Headache, unspecified: Secondary | ICD-10-CM | POA: Insufficient documentation

## 2020-03-08 DIAGNOSIS — Y92410 Unspecified street and highway as the place of occurrence of the external cause: Secondary | ICD-10-CM | POA: Insufficient documentation

## 2020-03-08 DIAGNOSIS — M542 Cervicalgia: Secondary | ICD-10-CM | POA: Diagnosis not present

## 2020-03-08 NOTE — ED Notes (Signed)
Called pt name for VS recheck x4. No response from pt.
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
## 2022-03-22 IMAGING — DX DG FOOT COMPLETE 3+V*L*
1 series · 1 of 1 positions shown · non-contrast
Comparison: Earlier film of the same day

CLINICAL DATA: 4th PIP dislocation

EXAM:
LEFT FOOT - COMPLETE 3+ VIEW

[foot ap]
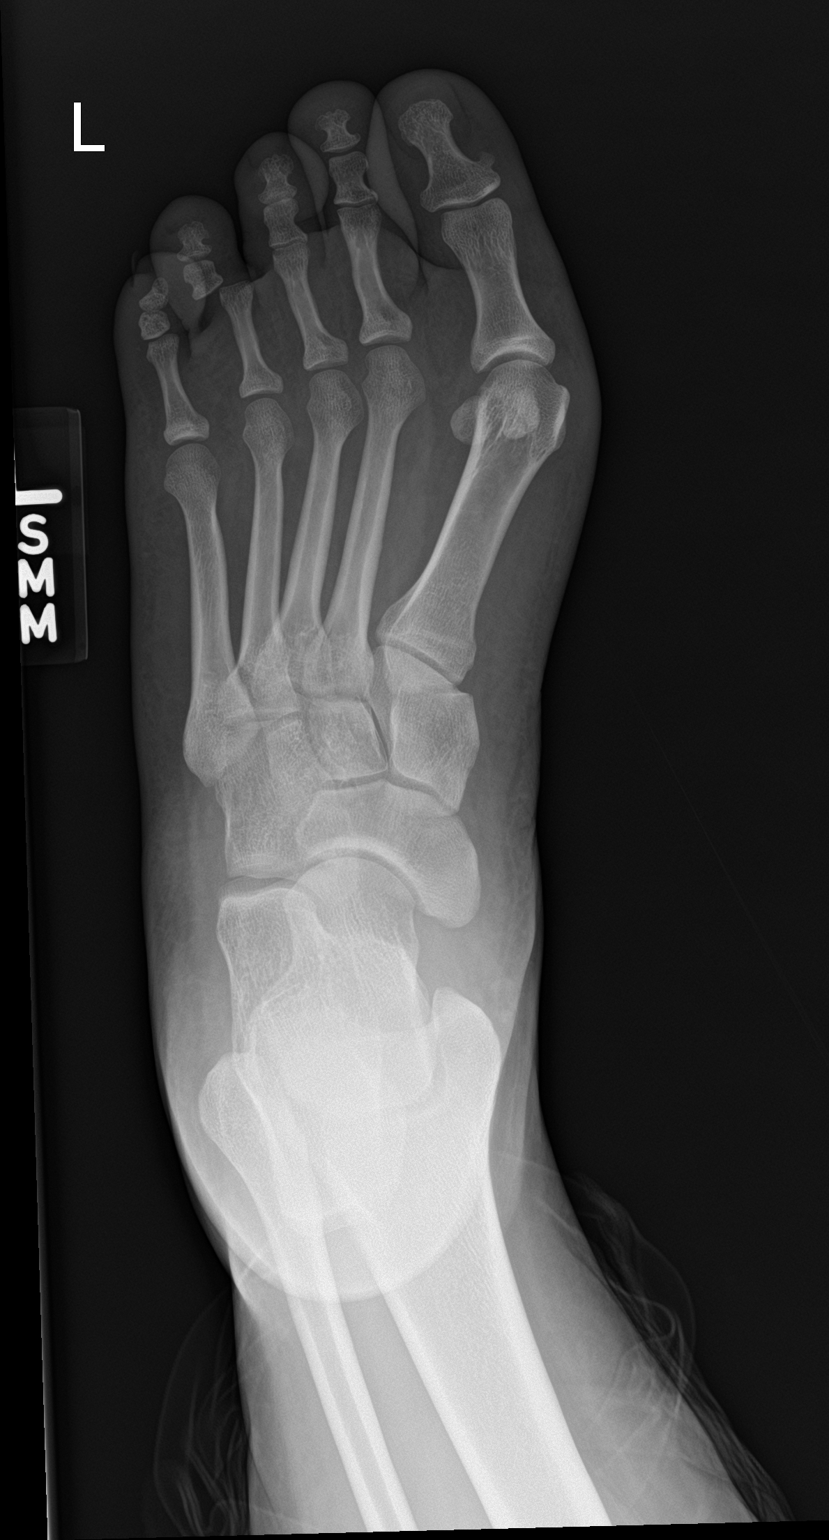

[1 of 1 positions shown; findings below may reference images not displayed]

FINDINGS: Persistent lateral dislocation of the fourth toe PIP joint. No
fracture. Otherwise normal mineralization and alignment. No
significant osseous degenerative change.
IMPRESSION: Persistent lateral dislocation of the left fourth toe PIP joint.
